# Patient Record
Sex: Male | Born: 1995 | Race: White | Hispanic: No | Marital: Single
Health system: Southern US, Academic
[De-identification: ages and names within clinical notes are randomized; demographics above are authoritative.]

## PROBLEM LIST (undated history)

## (undated) ENCOUNTER — Encounter

## (undated) ENCOUNTER — Telehealth

## (undated) ENCOUNTER — Ambulatory Visit: Payer: BLUE CROSS/BLUE SHIELD

## (undated) ENCOUNTER — Encounter: Attending: Pediatrics | Primary: Pediatrics

## (undated) ENCOUNTER — Ambulatory Visit

## (undated) ENCOUNTER — Encounter: Payer: PRIVATE HEALTH INSURANCE | Attending: Pediatrics | Primary: Pediatrics

## (undated) ENCOUNTER — Telehealth: Attending: Pediatrics | Primary: Pediatrics

## (undated) DIAGNOSIS — K5090 Crohn's disease, unspecified, without complications: Secondary | ICD-10-CM

---
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
## 2016-11-02 ENCOUNTER — Ambulatory Visit
Admission: RE | Admit: 2016-11-02 | Discharge: 2016-11-02 | Disposition: A | Discharge status: Home / Self Care | Payer: BC Managed Care – PPO | Admitting: Pediatrics

## 2016-11-02 DIAGNOSIS — K50814 Crohn's disease of both small and large intestine with abscess: Principal | ICD-10-CM

## 2017-03-17 ENCOUNTER — Encounter
Admit: 2017-03-17 | Discharge: 2017-03-18 | Payer: PRIVATE HEALTH INSURANCE | Attending: Pediatrics | Primary: Pediatrics

## 2017-03-17 DIAGNOSIS — K50814 Crohn's disease of both small and large intestine with abscess: Principal | ICD-10-CM

## 2017-03-17 DIAGNOSIS — K5080 Crohn's disease of both small and large intestine without complications: Secondary | ICD-10-CM

## 2017-05-16 MED ORDER — USTEKINUMAB 90 MG/ML SUBCUTANEOUS SYRINGE
INJECTION | PRN refills | 0 days | Status: CP
Start: 2017-05-16 — End: 2018-04-13

## 2017-07-10 ENCOUNTER — Encounter: Admit: 2017-07-10 | Discharge: 2017-07-11 | Payer: PRIVATE HEALTH INSURANCE

## 2017-07-10 DIAGNOSIS — R399 Unspecified symptoms and signs involving the genitourinary system: Principal | ICD-10-CM

## 2017-07-10 DIAGNOSIS — R350 Frequency of micturition: Secondary | ICD-10-CM

## 2017-07-24 ENCOUNTER — Ambulatory Visit: Payer: Self-pay

## 2017-08-19 ENCOUNTER — Ambulatory Visit
Admission: EM | Admit: 2017-08-19 | Discharge: 2017-08-19 | Disposition: A | Discharge status: Home / Self Care | Payer: BLUE CROSS/BLUE SHIELD | Attending: Family Medicine | Admitting: Family Medicine

## 2017-08-19 ENCOUNTER — Other Ambulatory Visit: Payer: Self-pay

## 2017-08-19 DIAGNOSIS — R05 Cough: Secondary | ICD-10-CM

## 2017-08-19 DIAGNOSIS — R059 Cough, unspecified: Secondary | ICD-10-CM

## 2017-08-19 DIAGNOSIS — J209 Acute bronchitis, unspecified: Secondary | ICD-10-CM | POA: Diagnosis not present

## 2017-08-19 HISTORY — DX: Crohn's disease, unspecified, without complications: K50.90

## 2017-08-19 MED ORDER — AZITHROMYCIN 250 MG PO TABS: ORAL_TABLET | ORAL | 0 refills | Status: AC

## 2017-08-19 MED ORDER — BENZONATATE 200 MG PO CAPS: 200.0000 mg | ORAL_CAPSULE | Freq: Three times a day (TID) | ORAL | 0 refills | Status: AC | PRN

## 2017-08-19 NOTE — ED Triage Notes (Addendum)
Pt with 2 weeks of cough. Woke up today with left sided chest pain worse with cough and deep breath. Pain is dull and does not radiate. Pain 5/10 . Seen at PCP a few weeks ago and was given Prednisone and a cough medication

## 2017-08-20 NOTE — ED Provider Notes (Signed)
[facility]    CSN: [id number] Arrival date & time: 08/19/17  1524     History   Chief Complaint Chief Complaint  Patient presents with  . Pleurisy    HPI Darin Cook is a 22 y.o. male.   The history is provided by the patient.  URI  Presenting symptoms: cough   Severity:  Moderate Onset quality:  Sudden Duration:  3 weeks Timing:  Constant Progression:  Worsening Chronicity:  New Relieved by:  Nothing Ineffective treatments:  Prescription medications and OTC medications (reports taking prednisone and cough medicine given by PCP several weeks ago) Associated symptoms: no wheezing   Associated symptoms comment:  C/o left sided chest wall pain when coughing or taking deep breaths Risk factors: sick contacts   Risk factors: not elderly, no chronic cardiac disease, no chronic kidney disease, no chronic respiratory disease, no diabetes mellitus, no immunosuppression, no recent illness and no recent travel     Past Medical History:  Diagnosis Date  . Crohn's disease (HCC)     There are no active problems to display for this patient.   History reviewed. No pertinent surgical history.     Home Medications    Prior to Admission medications   Medication Sig Start Date End Date Taking? Authorizing Provider  ustekinumab (STELARA) 90 MG/ML SOSY injection Inject 1 prefilled syringe subcutaneously every 8 weeks 05/16/17  Yes [provider]  azithromycin (ZITHROMAX Z-PAK) 250 MG tablet 2 tabs po once day 1, then 1 tab po qd for next 4 days 08/19/17   Payton Mccallum, MD  benzonatate (TESSALON) 200 MG capsule Take 1 capsule (200 mg total) by mouth 3 (three) times daily as needed. 08/19/17   Payton Mccallum, MD  Multiple Vitamin (MULTI-VITAMINS) TABS Take by mouth.    [provider]    Family History History reviewed. No pertinent family history.  Social History Social History   Tobacco Use  . Smoking status: Never Smoker  . Smokeless  tobacco: Never Used  Substance Use Topics  . Alcohol use: Yes    Comment: social  . Drug use: Never     Allergies   Codeine   Review of Systems Review of Systems  Respiratory: Positive for cough. Negative for wheezing.      Physical Exam Triage Vital Signs ED Triage Vitals  Enc Vitals Group     BP 08/19/17 1537 133/78     Pulse Rate 08/19/17 1537 84     Resp 08/19/17 1537 16     Temp 08/19/17 1537 98.2 F (36.8 C)     Temp Source 08/19/17 1537 Oral     SpO2 08/19/17 1537 99 %     Weight 08/19/17 1540 138 lb (62.6 kg)     Height 08/19/17 1540 5\' 7"  (1.702 m)     Head Circumference --      Peak Flow --      Pain Score 08/19/17 1539 5     Pain Loc --      Pain Edu? --      Excl. in GC? --    No data found.  Updated Vital Signs BP 133/78 (BP Location: Right Arm)   Pulse 84   Temp 98.2 F (36.8 C) (Oral)   Resp 16   Ht 5\' 7"  (1.702 m)   Wt 62.6 kg   SpO2 99%   BMI 21.61 kg/m   Visual Acuity Right Eye Distance:   Left Eye Distance:   Bilateral Distance:  Right Eye Near:   Left Eye Near:    Bilateral Near:     Physical Exam  Constitutional: He appears well-developed and well-nourished. No distress.  HENT:  Head: Normocephalic and atraumatic.  Right Ear: Tympanic membrane, external ear and ear canal normal.  Left Ear: Tympanic membrane, external ear and ear canal normal.  Nose: Nose normal.  Mouth/Throat: Uvula is midline, oropharynx is clear and moist and mucous membranes are normal. No oropharyngeal exudate or tonsillar abscesses.  Eyes: Pupils are equal, round, and reactive to light. Conjunctivae and EOM are normal. Right eye exhibits no discharge. Left eye exhibits no discharge. No scleral icterus.  Neck: Normal range of motion. Neck supple. No tracheal deviation present. No thyromegaly present.  Cardiovascular: Normal rate, regular rhythm and normal heart sounds.  Pulmonary/Chest: Effort normal. No stridor. No respiratory distress. He has no  wheezes. He has no rales. He exhibits no tenderness.  Few, diffuse rhochi  Lymphadenopathy:    He has no cervical adenopathy.  Neurological: He is alert.  Skin: Skin is warm and dry. No rash noted. He is not diaphoretic.  Nursing note and vitals reviewed.    UC Treatments / Results  Labs (all labs ordered are listed, but only abnormal results are displayed) Labs Reviewed - No data to display  EKG None  Radiology No results found.  Procedures Procedures (including critical care time)  Medications Ordered in UC Medications - No data to display  Initial Impression / Assessment and Plan / UC Course  I have reviewed the triage vital signs and the nursing notes.  Pertinent labs & imaging results that were available during my care of the patient were reviewed by me and considered in my medical decision making (see chart for details).      Final Clinical Impressions(s) / UC Diagnoses   Final diagnoses:  Cough  Acute bronchitis, unspecified organism   Discharge Instructions   None    ED Prescriptions    Medication Sig Dispense Auth. Provider   azithromycin (ZITHROMAX Z-PAK) 250 MG tablet 2 tabs po once day 1, then 1 tab po qd for next 4 days 6 each Payton Mccallumonty, Creasie Lacosse, MD   benzonatate (TESSALON) 200 MG capsule Take 1 capsule (200 mg total) by mouth 3 (three) times daily as needed. 30 capsule Payton Mccallumonty, Laure Leone, MD     1. diagnosis reviewed with patient 2. rx as per orders above; reviewed possible side effects, interactions, risks and benefits  3. Recommend supportive treatment with otc analgesics prn 4. Follow-up prn if symptoms worsen or don't improve   Controlled Substance Prescriptions [place] Controlled Substance Registry consulted? Not Applicable   Payton Mccallumonty, Nicey Krah, MD 08/20/17 [phone]

## 2017-08-23 ENCOUNTER — Encounter
Admit: 2017-08-23 | Discharge: 2017-08-24 | Payer: PRIVATE HEALTH INSURANCE | Attending: Pediatrics | Primary: Pediatrics

## 2017-08-23 DIAGNOSIS — K50814 Crohn's disease of both small and large intestine with abscess: Principal | ICD-10-CM

## 2017-08-23 DIAGNOSIS — K5080 Crohn's disease of both small and large intestine without complications: Secondary | ICD-10-CM

## 2017-12-29 ENCOUNTER — Encounter
Admit: 2017-12-29 | Discharge: 2017-12-30 | Payer: PRIVATE HEALTH INSURANCE | Attending: Pediatrics | Primary: Pediatrics

## 2017-12-29 DIAGNOSIS — K50814 Crohn's disease of both small and large intestine with abscess: Principal | ICD-10-CM

## 2018-04-13 MED ORDER — STELARA 90 MG/ML SUBCUTANEOUS SYRINGE
INJECTION | 3 refills | 0 days | Status: CP
Start: 2018-04-13 — End: ?

## 2018-11-02 DIAGNOSIS — K50019 Crohn's disease of small intestine with unspecified complications: Principal | ICD-10-CM

## 2018-11-02 MED ORDER — STELARA 90 MG/ML SUBCUTANEOUS SYRINGE: Syringe | 1 refills | 0 days | Status: AC

## 2018-11-27 DIAGNOSIS — K50019 Crohn's disease of small intestine with unspecified complications: Principal | ICD-10-CM

## 2018-11-27 MED ORDER — STELARA 90 MG/ML SUBCUTANEOUS SYRINGE
INJECTION | 1 refills | 0 days | Status: CP
Start: 2018-11-27 — End: ?

## 2018-12-18 DIAGNOSIS — K50814 Crohn's disease of both small and large intestine with abscess: Principal | ICD-10-CM

## 2018-12-24 ENCOUNTER — Encounter
Admit: 2018-12-24 | Discharge: 2018-12-25 | Payer: PRIVATE HEALTH INSURANCE | Attending: Pediatrics | Primary: Pediatrics

## 2018-12-24 DIAGNOSIS — R05 Cough: Principal | ICD-10-CM

## 2018-12-24 DIAGNOSIS — K50019 Crohn's disease of small intestine with unspecified complications: Principal | ICD-10-CM

## 2018-12-24 MED ORDER — STELARA 90 MG/ML SUBCUTANEOUS SYRINGE
INJECTION | 6 refills | 0 days | Status: CP
Start: 2018-12-24 — End: ?

## 2019-08-09 ENCOUNTER — Encounter
Admit: 2019-08-09 | Discharge: 2019-08-10 | Payer: PRIVATE HEALTH INSURANCE | Attending: Pediatrics | Primary: Pediatrics

## 2019-08-09 DIAGNOSIS — K50814 Crohn's disease of both small and large intestine with abscess: Principal | ICD-10-CM

## 2019-08-27 MED ORDER — ERGOCALCIFEROL (VITAMIN D2) 1,250 MCG (50,000 UNIT) CAPSULE
ORAL_CAPSULE | ORAL | 3 refills | 42.00000 days | Status: CP
Start: 2019-08-27 — End: 2020-02-11

## 2019-12-25 DIAGNOSIS — K50019 Crohn's disease of small intestine with unspecified complications: Principal | ICD-10-CM

## 2019-12-25 MED ORDER — STELARA 90 MG/ML SUBCUTANEOUS SYRINGE
SUBCUTANEOUS | 1 refills | 56 days | Status: CP
Start: 2019-12-25 — End: 2020-04-15

## 2020-04-14 DIAGNOSIS — K50019 Crohn's disease of small intestine with unspecified complications: Principal | ICD-10-CM

## 2020-04-14 MED ORDER — STELARA 90 MG/ML SUBCUTANEOUS SYRINGE
SUBCUTANEOUS | 1 refills | 56 days | Status: CP
Start: 2020-04-14 — End: 2020-08-04

## 2020-07-27 DIAGNOSIS — K50019 Crohn's disease of small intestine with unspecified complications: Principal | ICD-10-CM

## 2020-07-27 MED ORDER — STELARA 90 MG/ML SUBCUTANEOUS SYRINGE
SUBCUTANEOUS | 0 refills | 56 days | Status: CP
Start: 2020-07-27 — End: 2020-09-21

## 2020-09-07 ENCOUNTER — Ambulatory Visit
Admit: 2020-09-07 | Discharge: 2020-09-08 | Payer: PRIVATE HEALTH INSURANCE | Attending: Pediatrics | Primary: Pediatrics

## 2020-09-07 DIAGNOSIS — K50019 Crohn's disease of small intestine with unspecified complications: Principal | ICD-10-CM

## 2020-09-07 DIAGNOSIS — K50814 Crohn's disease of both small and large intestine with abscess: Principal | ICD-10-CM

## 2020-09-23 DIAGNOSIS — K50019 Crohn's disease of small intestine with unspecified complications: Principal | ICD-10-CM

## 2020-09-23 MED ORDER — STELARA 90 MG/ML SUBCUTANEOUS SYRINGE
2 refills | 0 days | Status: CP
Start: 2020-09-23 — End: 2021-03-10

## 2020-11-17 ENCOUNTER — Ambulatory Visit: Admit: 2020-11-17 | Discharge: 2020-11-18 | Payer: PRIVATE HEALTH INSURANCE

## 2020-11-17 DIAGNOSIS — K50814 Crohn's disease of both small and large intestine with abscess: Principal | ICD-10-CM

## 2021-02-03 DIAGNOSIS — K50019 Crohn's disease of small intestine with unspecified complications: Principal | ICD-10-CM

## 2021-02-03 MED ORDER — USTEKINUMAB 90 MG/ML SUBCUTANEOUS SYRINGE
SUBCUTANEOUS | 6 refills | 56.00000 days | Status: CP
Start: 2021-02-03 — End: 2021-07-21

## 2021-02-04 DIAGNOSIS — K50019 Crohn's disease of small intestine with unspecified complications: Principal | ICD-10-CM

## 2021-05-25 ENCOUNTER — Ambulatory Visit: Admit: 2021-05-25 | Discharge: 2021-05-26 | Payer: PRIVATE HEALTH INSURANCE

## 2021-05-25 DIAGNOSIS — K50814 Crohn's disease of both small and large intestine with abscess: Principal | ICD-10-CM

## 2021-08-10 MED ORDER — BISACODYL 5 MG TABLET,DELAYED RELEASE
ORAL_TABLET | Freq: Once | ORAL | 0 refills | 1 days | Status: CP
Start: 2021-08-10 — End: 2021-08-10

## 2021-08-10 MED ORDER — SIMETHICONE 125 MG CHEWABLE TABLET
ORAL_TABLET | ORAL | 0 refills | 0 days | Status: CP
Start: 2021-08-10 — End: ?

## 2021-08-10 MED ORDER — POLYETHYLENE GLYCOL 3350 17 GRAM/DOSE ORAL POWDER
ORAL | 0 refills | 0.00000 days | Status: CP
Start: 2021-08-10 — End: ?

## 2021-08-13 ENCOUNTER — Ambulatory Visit: Admit: 2021-08-13 | Discharge: 2021-08-13 | Payer: PRIVATE HEALTH INSURANCE

## 2021-08-13 ENCOUNTER — Encounter
Admit: 2021-08-13 | Discharge: 2021-08-13 | Payer: PRIVATE HEALTH INSURANCE | Attending: Anesthesiology | Primary: Anesthesiology

## 2021-11-30 ENCOUNTER — Ambulatory Visit: Admit: 2021-11-30 | Discharge: 2021-12-01 | Payer: PRIVATE HEALTH INSURANCE

## 2021-11-30 DIAGNOSIS — E559 Vitamin D deficiency, unspecified: Principal | ICD-10-CM

## 2021-11-30 DIAGNOSIS — Z79899 Other long term (current) drug therapy: Principal | ICD-10-CM

## 2021-11-30 DIAGNOSIS — K5080 Crohn's disease of both small and large intestine without complications: Principal | ICD-10-CM

## 2021-11-30 MED ORDER — CHOLECALCIFEROL (VITAMIN D3) 1,250 MCG (50,000 UNIT) CAPSULE
ORAL_CAPSULE | ORAL | 0 refills | 56 days | Status: CP
Start: 2021-11-30 — End: 2022-01-19

## 2022-01-31 DIAGNOSIS — K5080 Crohn's disease of both small and large intestine without complications: Principal | ICD-10-CM

## 2022-01-31 MED ORDER — STELARA 90 MG/ML SUBCUTANEOUS SYRINGE
SUBCUTANEOUS | 2 refills | 56 days | Status: CP
Start: 2022-01-31 — End: ?

## 2022-02-06 DIAGNOSIS — K5080 Crohn's disease of both small and large intestine without complications: Principal | ICD-10-CM

## 2022-02-23 DIAGNOSIS — K5080 Crohn's disease of both small and large intestine without complications: Principal | ICD-10-CM

## 2022-02-24 DIAGNOSIS — K5080 Crohn's disease of both small and large intestine without complications: Principal | ICD-10-CM

## 2022-02-25 DIAGNOSIS — K5080 Crohn's disease of both small and large intestine without complications: Principal | ICD-10-CM

## 2022-02-25 MED ORDER — STELARA 90 MG/ML SUBCUTANEOUS SYRINGE
SUBCUTANEOUS | 1 refills | 56 days | Status: CP
Start: 2022-02-25 — End: ?
  Filled 2022-03-14: qty 1, 56d supply, fill #0

## 2022-03-02 NOTE — Unmapped (Signed)
[facility] Specialty Medication Onboarding    Specialty Medication: STELARA 90 mg/mL Syrg syringe (ustekinumab)  Prior Authorization: Approved   Financial Assistance: Yes - copay card approved as secondary   Final Copay/Day Supply: $5 / 56 days    Insurance Restrictions: None     Notes to Pharmacist:     The triage team has completed the benefits investigation and has determined that the patient is able to fill this medication at [facility]. Please contact the patient to complete the onboarding or follow up with the prescribing physician as needed.

## 2022-03-02 NOTE — Unmapped (Signed)
 [facility]   Patient Onboarding/Medication Counseling    [facility] MyChart Questionnaire Opt In       Allen Harris is a 27 y.o. male with Crohn's disease who I am counseling today on continuation of therapy.  I am speaking to the patient.    Was a Nurse, learning disability used for this call? No    Verified patient's date of birth / HIPAA.    Specialty medication(s) to be sent: Inflammatory Disorders: Stelara      Non-specialty medications/supplies to be sent: sharps      Medications not needed at this time: n/a         Stelara (ustekinumab)    The patient declined counseling on medication administration, missed dose instructions, goals of therapy, side effects and monitoring parameters, warnings and precautions, drug/food interactions, and storage, handling precautions, and disposal because they have taken the medication previously. The information in the declined sections below are for informational purposes only and was not discussed with patient.       Medication & Administration     Dosage: Crohn's disease: Inject 90mg  under the skin every 8 weeks staring 8 weeks after IV induction dose    Lab tests required prior to treatment initiation:  Tuberculosis: Tuberculosis screening resulted in a non-reactive Quantiferon TB Gold assay.    Administration:     Artist all supplies needed for injection on a clean, flat working surface: medication syringe(s) removed from packaging, alcohol swab, sharps container, etc.  Look at the medication label - look for correct medication, correct dose, and check the expiration date  Look at the medication - the liquid in the syringe should appear clear and colorless to slightly yellow, you may see a few white particles  Lay the syringe on a flat surface and allow it to warm up to room temperature for at least 15-30 minutes  Select injection site - you can use the front of your thigh or your belly (but not the area 2 inches around your belly button); if someone else is giving you the injection you can also use your upper arm in the skin covering your triceps muscle or in the buttocks  Prepare injection site - wash your hands and clean the skin at the injection site with an alcohol swab and let it air dry, do not touch the injection site again before the injection  Pull off the needle safety cap, do not remove until immediately prior to injection  Pinch the skin - with your hand not holding the syringe pinch up a fold of skin at the injection site using your forefinger and thumb  Insert the needle into the fold of skin at about a 45 degree angle - it's best to use a quick dart-like motion  Push the plunger down slowly as far as it will go until the syringe is empty, if the plunger is not fully depressed the needle shield will not extend to cover the needle when it is removed, hold the syringe in place for a full 5 seconds  Check that the syringe is empty and keep pressing down on the plunger while you pull the needle out at the same angle as inserted; after the needle is removed completely from the skin, release the plunger allowing the needle shield to activate and cover the used needle  Dispose of the used syringe immediately in your sharps disposal container, do not attempt to recap the needle prior to disposing  If you see any  blood at the injection site, press a cotton ball or gauze on the site and maintain pressure until the bleeding stops, do not rub the injection site      Adherence/Missed dose instructions:  If your injection is given more than 7-10 days after your scheduled injection date - consult your pharmacist for additional instructions on how to adjust your dosing schedule.    Goals of Therapy     Crohn's disease  Achieve remission of symptoms  Maintain remission of symptoms  Minimize long-term systemic glucocorticoid use  Prevent need for surgical procedures  Maintenance of effective psychosocial functioning      Side Effects & Monitoring Parameters Injection site reaction (redness, irritation, inflammation localized to the site of administration)  Signs of a common cold - minor sore throat, runny or stuffy nose, etc.  Feeling tired or weak  Headache    The following side effects should be reported to the provider:  Signs of a hypersensitivity reaction - rash; hives; itching; red, swollen, blistered, or peeling skin; wheezing; tightness in the chest or throat; difficulty breathing, swallowing, or talking; swelling of the mouth, face, lips, tongue, or throat; etc.  Reduced immune function - report signs of infection such as fever; chills; body aches; very bad sore throat; ear or sinus pain; cough; more sputum or change in color of sputum; pain with passing urine; wound that will not heal, etc.  Also at a slightly higher risk of some malignancies (mainly skin and blood cancers) due to this reduced immune function.  In the case of signs of infection - the patient should hold the next dose of Stelara?? and call your primary care provider to ensure adequate medical care.  Treatment may be resumed when infection is treated and patient is asymptomatic.  Changes in skin - a new growth or lump that forms; changes in shape, size, or color of a previous mole or marking  Shortness of breath or chest pain  Vaginal itching or discharge      Contraindications, Warnings, & Precautions     Have your bloodwork checked as you have been told by your prescriber  Talk with your doctor if you are pregnant, planning to become pregnant, or breastfeeding  Discuss the possible need for holding your dose(s) of Stelara?? when a planned procedure is scheduled with the prescriber as it may delay healing/recovery timeline       Drug/Food Interactions     Medication list reviewed in Epic. The patient was instructed to inform the care team before taking any new medications or supplements. No drug interactions identified.   If you have a latex allergy use caution when handling, the needle cap of the Stelara?? prefilled syringe contains a derivative of natural rubber latex  Talk with you prescriber or pharmacist before receiving any live vaccinations while taking this medication and after you stop taking it    Storage, Handling Precautions, & Disposal     Store this medication in the refrigerator.  Do not freeze  If needed, you may store at room temperature for up to 30 days  Store in original packaging, protected from light  Do not shake  Dispose of used syringes/pens in a sharps disposal container          Current Medications (including OTC/herbals), Comorbidities and Allergies     Current Outpatient Medications   Medication Sig Dispense Refill    multivitamin (THERAGRAN) per tablet Take 1 tablet by mouth daily.      STELARA 90 mg/mL  Syrg syringe Inject the contents of 1 syringe (90 mg total) under the skin every 8 weeks. 1 mL 1     No current facility-administered medications for this visit.       Allergies   Allergen Reactions    Codeine Nausea Only, Swelling and Nausea And Vomiting       Patient Active Problem List   Diagnosis    Adjustment disorder with anxiety    Anxiety    Crohn's disease of small intestine (CMS-HCC)    Crohn's disease of both small and large intestine without complication (CMS-HCC)       Reviewed and up to date in Epic.    Appropriateness of Therapy     Acute infections noted within Epic:  No active infections  Patient reported infection: None    Is medication and dose appropriate based on diagnosis and infection status? Yes    Prescription has been clinically reviewed: Yes      Baseline Quality of Life Assessment      How many days over the past month did your Crohn's disease  keep you from your normal activities? For example, brushing your teeth or getting up in the morning. 0    Financial Information     Medication Assistance provided: Prior Authorization    Anticipated copay of $5 reviewed with patient. Verified delivery address.    Delivery Information     Scheduled delivery date: 3/8    Expected start date: 3/4    Patient was notified of new phone menu: Yes    Medication will be delivered via Same Day Courier to the prescription address in Epic [place].  This shipment will not require a signature.      Explained the services we provide at [facility] and that each month we would call to set up refills.  Stressed importance of returning phone calls so that we could ensure they receive their medications in time each month.  Informed patient that we should be setting up refills 7-10 days prior to when they will run out of medication.  A pharmacist will reach out to perform a clinical assessment periodically.  Informed patient that a welcome packet, containing information about our pharmacy and other support services, a Notice of Privacy Practices, and a drug information handout will be sent.      The patient or caregiver noted above participated in the development of this care plan and knows that they can request review of or adjustments to the care plan at any time.      Patient or caregiver verbalized understanding of the above information as well as how to contact the pharmacy at [phone] option 4 with any questions/concerns.  The pharmacy is open Monday through Friday 8:30am-4:30pm.  A pharmacist is available 24/7 via pager to answer any clinical questions they may have.    Patient Specific Needs     Does the patient have any physical, cognitive, or cultural barriers? No    Does the patient have adequate living arrangements? (i.e. the ability to store and take their medication appropriately) Yes    Did you identify any home environmental safety or security hazards? No    Patient prefers to have medications discussed with  Patient     Is the patient or caregiver able to read and understand education materials at a high school level or above? Yes    Patient's primary language is  English     Is the patient high risk? No  SOCIAL DETERMINANTS OF HEALTH     At the [facility], we have learned that life circumstances - like trouble affording food, housing, utilities, or transportation can affect the health of many of our patients.   That is why we wanted to ask: are you currently experiencing any life circumstances that are negatively impacting your health and/or quality of life? No    Social Determinants of Psychologist, prison and probation services Strain: Not on file   Internet Connectivity: Not on file   Food Insecurity: Not on file   Tobacco Use: Low Risk  (11/30/2021)    Patient History     Smoking Tobacco Use: Never     Smokeless Tobacco Use: Never     Passive Exposure: Not on file   Housing/Utilities: Not on file   Alcohol Use: Not on file   Transportation Needs: Not on file   Substance Use: Not on file   Health Literacy: Not on file   Physical Activity: Not on file   Interpersonal Safety: Not on file   Stress: Not on file   Intimate Partner Violence: Not on file   Depression: Not at risk (07/16/2021)    Received from [facility]    PHQ-2     Total Score =: 0   Social Connections: Not on file       Would you be willing to receive help with any of the needs that you have identified today? Not applicable       Teofilo Pod, PharmD  [facility] Specialty Pharmacist

## 2022-03-07 MED ORDER — EMPTY CONTAINER
3 refills | 0 days
Start: 2022-03-07 — End: ?

## 2022-03-14 MED FILL — EMPTY CONTAINER: 120 days supply | Qty: 1 | Fill #0

## 2022-05-02 NOTE — Unmapped (Signed)
[facility] Clinical Assessment & Refill Coordination Note    Allen Harris, DOB: [birth year]  Phone: [phone] (home)     All above HIPAA information was verified with patient.     Was a Nurse, learning disability used for this call? No    Specialty Medication(s):   Inflammatory Disorders: Stelara     Current Outpatient Medications   Medication Sig Dispense Refill    empty container Misc Use as directed to dispose of Stelara 1 each 3    multivitamin (THERAGRAN) per tablet Take 1 tablet by mouth daily.      STELARA 90 mg/mL Syrg syringe Inject the contents of 1 syringe (90 mg total) under the skin every 8 weeks. 1 mL 1     No current facility-administered medications for this visit.        Changes to medications: Jaimar reports no changes at this time.    Allergies   Allergen Reactions    Codeine Nausea Only, Swelling and Nausea And Vomiting       Changes to allergies: No    SPECIALTY MEDICATION ADHERENCE     Stelara 90 mg/ml: 0 days of medicine on hand       Medication Adherence    Patient reported X missed doses in the last month: 0  Specialty Medication: Stelara          Specialty medication(s) dose(s) confirmed: Regimen is correct and unchanged.     Are there any concerns with adherence? No    Adherence counseling provided? Not needed    CLINICAL MANAGEMENT AND INTERVENTION      Clinical Benefit Assessment:    Do you feel the medicine is effective or helping your condition? Yes    Clinical Benefit counseling provided? Not needed    Adverse Effects Assessment:    Are you experiencing any side effects? No    Are you experiencing difficulty administering your medicine? No    Quality of Life Assessment:    Quality of Life    Rheumatology  Oncology  Dermatology  Cystic Fibrosis          How many days over the past month did your Crohn's disease  keep you from your normal activities? For example, brushing your teeth or getting up in the morning. 0    Have you discussed this with your provider? Not needed    Acute Infection Status:    Acute infections noted within Epic:  No active infections  Patient reported infection: None    Therapy Appropriateness:    Is therapy appropriate and patient progressing towards therapeutic goals? Yes, therapy is appropriate and should be continued    DISEASE/MEDICATION-SPECIFIC INFORMATION      For patients on injectable medications: Patient currently has 0 doses left.  Next injection is scheduled for 4/29.    Chronic Inflammatory Diseases: Have you experienced any flares in the last month? No  Has this been reported to your provider? Not applicable    PATIENT SPECIFIC NEEDS     Does the patient have any physical, cognitive, or cultural barriers? No    Is the patient high risk? No    Did the patient require a clinical intervention? No    Does the patient require physician intervention or other additional services (i.e., nutrition, smoking cessation, social work)? No    SOCIAL DETERMINANTS OF HEALTH     At the [facility], we have learned that life circumstances - like trouble affording food, housing, utilities, or transportation  can affect the health of many of our patients.   That is why we wanted to ask: are you currently experiencing any life circumstances that are negatively impacting your health and/or quality of life? Patient declined to answer    Social Determinants of Health     Financial Resource Strain: Not on file   Internet Connectivity: Not on file   Food Insecurity: Not on file   Tobacco Use: Low Risk  (11/30/2021)    Patient History     Smoking Tobacco Use: Never     Smokeless Tobacco Use: Never     Passive Exposure: Not on file   Housing/Utilities: Not on file   Alcohol Use: Not on file   Transportation Needs: Not on file   Substance Use: Not on file   Health Literacy: Not on file   Physical Activity: Not on file   Interpersonal Safety: Not on file   Stress: Not on file   Intimate Partner Violence: Not on file   Depression: Not at risk (07/16/2021)    Received from [facility]    PHQ-2     Total Score =: 0   Social Connections: Not on file       Would you be willing to receive help with any of the needs that you have identified today? Not applicable       SHIPPING     Specialty Medication(s) to be Shipped:   Inflammatory Disorders: Stelara    Other medication(s) to be shipped: No additional medications requested for fill at this time     Changes to insurance: No    Delivery Scheduled: Yes, Expected medication delivery date: 4/25.     Medication will be delivered via Same Day Courier to the confirmed prescription address in [facility].    The patient will receive a drug information handout for each medication shipped and additional FDA Medication Guides as required.  Verified that patient has previously received a Conservation officer, historic buildings and a Surveyor, mining.    The patient or caregiver noted above participated in the development of this care plan and knows that they can request review of or adjustments to the care plan at any time.      All of the patient's questions and concerns have been addressed.    Clydell Hakim, PharmD   [facility] Specialty Pharmacist

## 2022-05-05 MED FILL — STELARA 90 MG/ML SUBCUTANEOUS SYRINGE: SUBCUTANEOUS | 56 days supply | Qty: 1 | Fill #1

## 2022-05-24 ENCOUNTER — Ambulatory Visit: Admit: 2022-05-24 | Discharge: 2022-05-25 | Payer: PRIVATE HEALTH INSURANCE

## 2022-05-24 DIAGNOSIS — K5080 Crohn's disease of both small and large intestine without complications: Principal | ICD-10-CM

## 2022-05-24 DIAGNOSIS — D1801 Hemangioma of skin and subcutaneous tissue: Principal | ICD-10-CM

## 2022-05-24 LAB — COMPREHENSIVE METABOLIC PANEL
ALBUMIN: 4.7 g/dL (ref 3.4–5.0)
ALKALINE PHOSPHATASE: 105 U/L (ref 46–116)
ALT (SGPT): 18 U/L (ref 10–49)
ANION GAP: 8 mmol/L (ref 5–14)
AST (SGOT): 17 U/L (ref <=34)
BILIRUBIN TOTAL: 0.6 mg/dL (ref 0.3–1.2)
BLOOD UREA NITROGEN: 12 mg/dL (ref 9–23)
BUN / CREAT RATIO: 16
CALCIUM: 9.8 mg/dL (ref 8.7–10.4)
CHLORIDE: 105 mmol/L (ref 98–107)
CO2: 26.9 mmol/L (ref 20.0–31.0)
CREATININE: 0.74 mg/dL
EGFR CKD-EPI (2021) MALE: >90 mL/min/{1.73_m2} (ref >=60)
GLUCOSE RANDOM: 89 mg/dL (ref 70–99)
POTASSIUM: 3.8 mmol/L (ref 3.4–4.8)
PROTEIN TOTAL: 7.8 g/dL (ref 5.7–8.2)
SODIUM: 140 mmol/L (ref 135–145)

## 2022-05-24 LAB — CBC
HEMATOCRIT: 41.9 % (ref 39.0–48.0)
HEMOGLOBIN: 14.4 g/dL (ref 12.9–16.5)
MEAN CORPUSCULAR HEMOGLOBIN CONC: 34.3 g/dL (ref 32.0–36.0)
MEAN CORPUSCULAR HEMOGLOBIN: 28.9 pg (ref 25.9–32.4)
MEAN CORPUSCULAR VOLUME: 84.2 fL (ref 77.6–95.7)
MEAN PLATELET VOLUME: 7 fL (ref 6.8–10.7)
PLATELET COUNT: 384 10*9/L (ref 150–450)
RED BLOOD CELL COUNT: 4.98 10*12/L (ref 4.26–5.60)
RED CELL DISTRIBUTION WIDTH: 12.7 % (ref 12.2–15.2)
WBC ADJUSTED: 7.6 10*9/L (ref 3.6–11.2)

## 2022-05-24 NOTE — Unmapped (Addendum)
Manoah Ritsema it was a pleasure seeing you today.  Here is a summary from today's visit:     1.  We will order some labs today. I will message you with the results.  2.  For the red lesion on your face, this is most consistent with a hemangioma, it is a common finding and collection of blood vessels under the skin. Given that it is on your face and bothering you, we will refer you to dermatology to discuss potential treatment options.   3.  If any trouble or symptoms, do not hesitate to call.         EXPECTATIONS FOR PATIENT FOLLOW UP AND COMMUNICATION:  -- Follow up Appointments:  Crohn's disease and Ulcerative colitis are serious chronic inflammatory diseases which require close monitoring.  I expect to see patients for a follow up visits at least every 6 months (or more often if you are having a flare, starting new therapy, etc).  In select cases, patients who are only on aminosalicylates / mesalamine, may be seen once per year for follow up.  If you are not able to come for follow up appointments we may not be able to refill your medications or continue caring for you.   -- Appointment Type: While we are continuing to offer video appointments in select cases (stable patients who are not in a flare and without new/active symptoms) during the COVID19 pandemic, I expect to see patients in person at least once per year.   -- Communication:  if you have any questions or concerns, you can communicate with [place] via phone (contact information below) or via myChart.  Note that phone messages are given higher priority and triaged first. For urgent issues, please contact us via phone.      IBD NURSE COORDINATOR CONTACT - Christy Gentles, RN     Phone: [phone] (direct line)      Fax: [phone]  * For urgent medical concerns after hours or on weekends and holidays, call [phone] and ask to speak to the GI Fellow on call.    * If you have a GI medical question or GI symptoms and would like to speak to your provider's healthcare team, please contact Christy Gentles, RN (contact information above) OR you can send the GI healthcare team a message through MyChart at [email or web address]    [facility] MESSAGES  -- MyChart messages and advice requests are now going to be sent to our clinical care team. A team member will respond to the message or send it to your provider if needed.   -- The message will typically be addressed within 3-4 business days if it is an issue that can be handled by MyChart.   -- For multiple questions, complex concerns, and/or if you have not been by seen by your provider in the recommended follow-up period you may be asked to schedule a clinic visit.   -- MyChart messages are NOT read after hours or on weekends. MyChart is NOT meant for urgent issues or emergencies. For emergencies call 911 or go to the nearest emergency department.     APPOINTMENT SCHEDULING FOR GI CLINIC AND GI PROCEDURES:  RADIOLOGY - to schedule imaging ordered, please call [phone]  GI PROCEDURES         [phone] option 2   GI MEDICINE CLINIC  [phone] option 1   *To schedule, reschedule, or cancel your GI appointment, please call [phone]. If you are unable to come to an appointment, please notify us  as soon as possible, preferably 24 hours in advance. Doing so may allow other patients with urgent needs to be scheduled in a cancelled appointment slot.     TEST RESULTS   If you have a MyChart account, your new results and a provider message will be sent to you through your MyChart account at [email or web address]. For results that require follow-up, a member of your healthcare team will also contact you.    PRESCRIPTION REFILL REQUESTS  To request prescription refills, please contact your pharmacy or send your healthcare team a message through your MyChart account at [email or web address]  RECORD REQUESTS  For questions related to medical records, please call Medical Records Release of Information at [phone]  FINANCIAL NAVIGATOR   For billing and other financial questions/needs - please contact Kathrin Greathouse - [phone]. If you need to leave a message, please be sure to leave your full name, date of birth or Medical record number, best call back # and reason for call.    For educational material and resources:  [email or web address]    ================================================================

## 2022-05-24 NOTE — Unmapped (Signed)
[facility]  INFLAMMATORY BOWEL DISEASE  05/24/2022    Demographics:  Allen Harris is a 27 y.o. year old male    Referring physician:   Ciro Backer, MD  [address]  [id number]  [place],  [place] [address]          HPI / NOTE :   Allen Harris has been doing well lately. Tends to forget he has Crohn's. Having 1-2 BMS a day, no abdominal pain, no hematochezia, weight stable. 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 He uses ustekinumab as prescribed, and does not have any issues with medication availability or injection reactions.    Question regarding skin lesion on face. Bled when tried to pop it as thought it was a zit. Had one on back of neck and was itchy. Nontender. Is irritating as bleeds with shaving. Bothersome cosmetically as well given on face.           IBD HISTORY:     Year of disease onset: 2014  Diagnosis:  Crohn's Disease  Age at onset:   < 38 yr old (A1)  Location:  Ileocolonic (L3)  Behavior:  Nonstricturing,nonpenetrating (B1)  Perianal Dz:  Yes    Brief IBD Disease Course:    Diagnosed in high school. Has been in clinical remission on ustekinumab and pt is happy with disease control.     Endoscopy:      Colonoscopy 08/2021:   - The examined portion of the ileum was normal.  - The entire examined colon is normal.  - No specimens collected. Crohn's disease in endoscopic remission. (SES-CD=0)    Colonoscopy 12/08/2014:   - Perianal fistula found on perianal exam. Mild in severity.  - Mildly ulcerated mucosa in the recto-sigmoid colon. Biopsied.  - Moderately ulcerated mucosa in the terminal ileum. Biopsied.    EGD 12/08/2014:  - Moderately severe chronic esophagitis. Biopsied.  - Chronic gastritis. Biopsied.  - Normal examined duodenum. Biopsied.    Imaging:    MRI 2016  IMPRESSION:  Mild terminal ileitis. No evidence of abscess, stricture, or fistula. The appearance is much less severe than 2014.    Prior IBD medications (type, dose, duration, response):  Mercaptopurine   ustekinumab          Past Medical History:   Past medical history:   Past Medical History:   Diagnosis Date    Crohn's disease (CMS-HCC)     Elevated blood pressure reading 12/05/2016     Past surgical history:   Past Surgical History:   Procedure Laterality Date    PR BREATH HYDROGEN TEST N/A 04/22/2014    Procedure: BREATH HYDROGEN TEST;  Surgeon: Nurse-Based Giproc;  [facility];  Service: Gastroenterology    PR COLONOSCOPY FLX DX W/COLLJ SPEC WHEN PFRMD Left 08/13/2021    Procedure: COLONOSCOPY, FLEXIBLE, PROXIMAL TO SPLENIC FLEXURE; DIAGNOSTIC, W/WO COLLECTION SPECIMEN BY BRUSH OR WASH;  Surgeon: Modena Nunnery, MD;  Location: GI PROCEDURES [place] [facility];  Service: Gastroenterology    PR COLONOSCOPY W/BIOPSY SINGLE/MULTIPLE N/A 10/03/2012    Procedure: COLONOSCOPY, FLEXIBLE, PROXIMAL TO SPLENIC FLEXURE; WITH BIOPSY, SINGLE OR MULTIPLE;  Surgeon: Nat Christen, MD;  Location: PEDS PROCEDURE ROOM [facility];  Service: Gastroenterology    PR COLONOSCOPY W/BIOPSY SINGLE/MULTIPLE N/A 12/08/2014    Procedure: COLONOSCOPY, FLEXIBLE, PROXIMAL TO SPLENIC FLEXURE; WITH BIOPSY, SINGLE OR MULTIPLE;  Surgeon: Ciro Backer, MD;  Location: PEDS PROCEDURE ROOM [facility];  Service: Gastroenterology    PR  SURG DIAGNOSTIC EXAM, ANORECTAL N/A 10/09/2012    Procedure: ANORECTAL EXAM, SURGICAL, REQUIRING ANESTHESIA (GENERAL, SPINAL, OR EPIDURAL), DIAGNOSTIC;  Surgeon: Bunnie Pion, MD;  Location: [facility];  Service: Pediatric Surgery    PR UPPER GI ENDOSCOPY,BIOPSY N/A 10/03/2012    Procedure: UGI ENDOSCOPY; WITH BIOPSY, SINGLE OR MULTIPLE;  Surgeon: Nat Christen, MD;  Location: PEDS PROCEDURE ROOM [facility];  Service: Gastroenterology    PR UPPER GI ENDOSCOPY,BIOPSY N/A 12/08/2014    Procedure: UGI ENDOSCOPY; WITH BIOPSY, SINGLE OR MULTIPLE;  Surgeon: Ciro Backer, MD;  Location: PEDS PROCEDURE ROOM [facility];  Service: Gastroenterology Family history:   Family History   Problem Relation Age of Onset    Other Mother      Social history:   Social History     Socioeconomic History    Marital status: Single     Spouse name: None    Number of children: None    Years of education: None    Highest education level: None   Tobacco Use    Smoking status: Never    Smokeless tobacco: Never   Substance and Sexual Activity    Alcohol use: Not Currently     Comment: maybe once a month    Drug use: No           Allergies:   Codeine          Medications:     Current Outpatient Medications   Medication Sig Dispense Refill    empty container Misc Use as directed to dispose of Stelara 1 each 3    multivitamin (THERAGRAN) per tablet Take 1 tablet by mouth daily.      STELARA 90 mg/mL Syrg syringe Inject the contents of 1 syringe (90 mg total) under the skin every 8 weeks. 1 mL 1     No current facility-administered medications for this visit.           Physical Exam:   BP 111/83 (BP Site: L Arm, BP Position: Sitting, BP Cuff Size: Large)  - Pulse 83  - Temp 36.6 ??C (97.8 ??F)  - Ht 170.2 cm (5' 7)  - Wt 72.8 kg (160 lb 6.4 oz)  - BMI 25.12 kg/m??     Gen: Well-appearing, no acute distress  Eyes: no scleral icterus.  Resp: Normal work of breathing  GI: Nontender, non-distended.  Skin: Red lesion on left side of face near mouth             Labs, Data & Indices:     Lab Review:   Lab Results   Component Value Date    WBC 6.0 11/30/2021    WBC 6.9 08/26/2019    RBC 5.24 11/30/2021    RBC 4.82 08/26/2019    HGB 15.0 11/30/2021    HGB 13.8 08/26/2019     Lab Results   Component Value Date    AST 16 11/30/2021    AST 20 08/26/2019    ALT 19 11/30/2021    ALT 27 08/26/2019    BUN 10 11/30/2021    BUN 14 08/26/2019    Creatinine 0.81 11/30/2021    Creatinine 0.91 08/26/2019    CO2 26.3 11/30/2021    CO2 25 08/26/2019    Albumin 4.4 11/30/2021    Albumin 5.0 03/25/2014    Calcium 9.8 11/30/2021    Calcium 9.7 08/26/2019     Lab Results   Component Value Date    TSH 1.830 08/15/2014  Disease Severity Index:   Harvey-Bradshaw Index:    1. General well-being: Very well = 0  2. Abdominal pain:  None = 0  3. Number of liquid or soft stools per day: 0  4. Abdominal mass: None = 0  5. Complications: None    Harvey-Bradshaw Index score:  Remission <5  ............................................................................................................................................Marland Kitchen          Assessment & Recommendations:     Allen Harris is a 27 y.o. 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    Symptomatically he doing well, and is in clinical remission.  His recent colonoscopy in August 2023 demonstrated endoscopic remission. He will continue ustekinumab q8w.    We reviewed his healthcare maintenance today. He needs surveillance colonoscopy in around 3 years (2026). Given low Vit D at last visit now with supplementation, will recheck today.     There are several vaccines he would benefit from, but he is not interested in any vaccines at this time.    PLAN:  -Continue ustekinumab q8w  -Labs today  -Referral to dermatology for assessment of hemangioma  -Return to clinic in 6-12 months    IBD Healthcare Maintenance   Last colorectal cancer screening  (q1-5 years, 8 years after diagnosis) 2026   Metabolic bone disease screening  (steroids >102mo, past steroid use >65yr in past 44yr, maternal FHx osteoporosis, underweight, amenorrheic, postmenopausal women) N/A   Vitamin D deficiency screening  (q57yr) Low in 11/2021   Vitamin B12 deficiency screening  (If PSHx of ileal resection) Today   Iron deficiency screening Normal May 2023                 Vaccines Varicella  (Zoster IgG, consider vaccination if -) N/A    Herpes Zoster  (Anticipating IS) Declined    Tdap  (TDAP q10y or Td q2y) Declined    Influenza  (q12yr) Declined    HPV  (Age 2-26) Declined    Hepatitis A N/A    Hepatitis B UTD, but loss of immunity. Declined booster    Meningococcus  (At risk--college, military) Declined    Pneumococcus  (PSV23 if not IS; PCV13 then PSV23 after 8w if IS; PSV23 booster q30yr) Declined   Annual Pap smear  (if male and IS) N/A   Annual dermatology exam  (if IS) Referral placed   Tobacco abstinence Does not use   NSAID abstinence Rare use     Patient seen with GI Fellow, Geralyn Flash, MD

## 2022-05-25 LAB — VITAMIN D 25 HYDROXY: VITAMIN D, TOTAL (25OH): 16.7 ng/mL — ABNORMAL LOW (ref 20.0–80.0)

## 2022-05-29 ENCOUNTER — Emergency Department
Admit: 2022-05-29 | Discharge: 2022-05-29 | Disposition: A | Discharge status: Home / Self Care | Payer: PRIVATE HEALTH INSURANCE

## 2022-05-29 ENCOUNTER — Ambulatory Visit
Admit: 2022-05-29 | Discharge: 2022-05-29 | Disposition: A | Discharge status: Home / Self Care | Payer: PRIVATE HEALTH INSURANCE

## 2022-05-29 DIAGNOSIS — R55 Syncope and collapse: Principal | ICD-10-CM

## 2022-05-29 LAB — COMPREHENSIVE METABOLIC PANEL
ALBUMIN: 4.7 g/dL (ref 3.4–5.0)
ALKALINE PHOSPHATASE: 110 U/L (ref 46–116)
ALT (SGPT): 17 U/L (ref 10–49)
ANION GAP: 6 mmol/L (ref 5–14)
AST (SGOT): 14 U/L (ref <=34)
BILIRUBIN TOTAL: 0.5 mg/dL (ref 0.3–1.2)
BLOOD UREA NITROGEN: 12 mg/dL (ref 9–23)
BUN / CREAT RATIO: 16
CALCIUM: 9.9 mg/dL (ref 8.7–10.4)
CHLORIDE: 108 mmol/L — ABNORMAL HIGH (ref 98–107)
CO2: 27.2 mmol/L (ref 20.0–31.0)
CREATININE: 0.74 mg/dL
EGFR CKD-EPI (2021) MALE: >90 mL/min/{1.73_m2} (ref >=60)
GLUCOSE RANDOM: 85 mg/dL (ref 70–179)
POTASSIUM: 3.5 mmol/L (ref 3.4–4.8)
PROTEIN TOTAL: 8 g/dL (ref 5.7–8.2)
SODIUM: 141 mmol/L (ref 135–145)

## 2022-05-29 LAB — CBC W/ AUTO DIFF
BASOPHILS ABSOLUTE COUNT: 0 10*9/L (ref 0.0–0.1)
BASOPHILS RELATIVE PERCENT: 0.6 %
EOSINOPHILS ABSOLUTE COUNT: 0.1 10*9/L (ref 0.0–0.5)
EOSINOPHILS RELATIVE PERCENT: 1.7 %
HEMATOCRIT: 42.2 % (ref 39.0–48.0)
HEMOGLOBIN: 14.7 g/dL (ref 12.9–16.5)
LYMPHOCYTES ABSOLUTE COUNT: 2.8 10*9/L (ref 1.1–3.6)
LYMPHOCYTES RELATIVE PERCENT: 35.2 %
MEAN CORPUSCULAR HEMOGLOBIN CONC: 34.8 g/dL (ref 32.0–36.0)
MEAN CORPUSCULAR HEMOGLOBIN: 28.9 pg (ref 25.9–32.4)
MEAN CORPUSCULAR VOLUME: 83.2 fL (ref 77.6–95.7)
MEAN PLATELET VOLUME: 7 fL (ref 6.8–10.7)
MONOCYTES ABSOLUTE COUNT: 0.7 10*9/L (ref 0.3–0.8)
MONOCYTES RELATIVE PERCENT: 9.2 %
NEUTROPHILS ABSOLUTE COUNT: 4.3 10*9/L (ref 1.8–7.8)
NEUTROPHILS RELATIVE PERCENT: 53.3 %
NUCLEATED RED BLOOD CELLS: 0 /100{WBCs} (ref <=4)
PLATELET COUNT: 446 10*9/L (ref 150–450)
RED BLOOD CELL COUNT: 5.07 10*12/L (ref 4.26–5.60)
RED CELL DISTRIBUTION WIDTH: 13 % (ref 12.2–15.2)
WBC ADJUSTED: 8.1 10*9/L (ref 3.6–11.2)

## 2022-05-29 LAB — ETHANOL: ETHANOL: <10 mg/dL (ref <=10)

## 2022-05-29 MED ORDER — METHOCARBAMOL 500 MG TABLET
ORAL_TABLET | Freq: Two times a day (BID) | ORAL | 0 refills | 10 days | Status: CP
Start: 2022-05-29 — End: 2022-06-08

## 2022-05-29 MED ADMIN — methocarbamol (ROBAXIN) tablet 500 mg: 500 mg | ORAL | @ 21:00:00 | Stop: 2022-05-29

## 2022-05-29 NOTE — Unmapped (Signed)
Pt states he thinks he blocked out and fell this morning. He unable to recall anything. C/o pain to back and rib cage.

## 2022-05-29 NOTE — Unmapped (Shared)
[facility]  Emergency Department Provider Note      ED Clinical Impression      Final diagnoses:   None          Impression, Medical Decision Making, Progress Notes and Critical Care      Impression, Differential Diagnosis and Plan of Care    Allen Harris is a 27 y.o. male with a past medical history of Crohn's disease who presents to the ED for evaluation after an unwitnessed syncopal episode around 11:00AM today with unknown head strike.     On exam, patient is well appearing and in no acute distress. Vital signs are within normal limits; patient is hemodynamically stable, afebrile. Exam is remarkable for tenderness to palpation of the right mid-scapular ribs, probably 6-10, without crepitus or deformity.     Differential includes ***    Plan to obtain EKG, CXR, and CT head, as well as basic labs, EtOH, UA, and UDS. Will treat patient with ***.     Independent Interpretation of Studies    I have independently interpreted the following studies:  EKG: {** time/tracing time/my interpretation **}  X-ray(s): {** time/study/my interpretation **}  CT/MRI(s): {** time/study/my interpretation **}    Discussion of Management with other Providers or Support Staff    I discussed the management of this patient with the:  Admitting provider: {** time/admitting service AND name of provider/planned inpatient workup & management **}  Consultant(s): {** time/consulting service AND name of provider/summary of discussion **}  Radiologist: {** time/name of radiologist/study discussed/summary of discussion **}  ED Pharmacist: {** time/name of pharmacist/summary of discussion **}  Case Management/Social Work: {** time/reason for [employer] of discussion **}  Other: {** time/who you spoke with/summary of discussion **}    Considerations Regarding Disposition/Escalation of Care and Critical Care    Indications for observation/admission (or consideration of observation/admission) and/or appropriateness for outpatient management: {** time/pertinent +/- test results, response to treatment, likely diagnosis(es) and factors supporting discharge or reasons admission is necessary **}  Patient/Family/Caregiver Discussions: {** time/understanding of plan of care, need for admission/discharge plan, code status discussions/decision to de-escalate care because of poor prognosis **}  Diagnostic Tests Considered But Not Done: ***  Prescription Drugs Provided or Considered But Not Given: {** Antibiotics, Antivirals, Pain medications, etc. **}  Social Determinants of Health which significantly affected care: N/A    Additional Progress Notes    {** .EDCOURSE or .TIMESTAMP/progress note **}    Portions of this record have been created using Scientist, clinical (histocompatibility and immunogenetics). Dictation errors have been sought, but may not have been identified and corrected.    See chart and resident provider documentation for details.  ___________________________________________     History     Reason for Visit  Syncope      HPI   Allen Harris is a 27 y.o. male with a past medical history of Crohn's disease who presents to the ED for evaluation of syncope. The patient reports that he remembers talking to a friend on the phone around 10:30AM, then woke suddenly on the ground about 30 minutes later, unable to recall the events of his fall. Unknown head strike. He is not anticoagulated. He has been able to ambulate since the incident. No history of syncopal episodes. Currently, he reports severe right-sided back pain at the posterior, inferior rib margins, which worsens with deep inspiration. He denies chest pain, shortness of breath, nausea, vomiting, abdominal pain, dizziness, or headache.     Outside Historian(s)  (EMS, Significant Other,  Family, Parent, Caregiver, Friend, Patent examiner, etc.)    Patient's father at bedside    External Records Reviewed  (Inpatient/Outpatient notes, Prior labs/imaging studies, Care Everywhere, PDMP, External ED notes, etc)    05/24/2022 GI note for patient's past medical history.    Past Medical History:   Diagnosis Date    Crohn's disease (CMS-HCC)     Elevated blood pressure reading 12/05/2016       Patient Active Problem List   Diagnosis    Adjustment disorder with anxiety    Anxiety    Crohn's disease of small intestine (CMS-HCC)    Crohn's disease of both small and large intestine without complication (CMS-HCC)       Past Surgical History:   Procedure Laterality Date    PR BREATH HYDROGEN TEST N/A 04/22/2014    Procedure: BREATH HYDROGEN TEST;  Surgeon: Nurse-Based Giproc;  [facility];  Service: Gastroenterology    PR COLONOSCOPY FLX DX W/COLLJ SPEC WHEN PFRMD Left 08/13/2021    Procedure: COLONOSCOPY, FLEXIBLE, PROXIMAL TO SPLENIC FLEXURE; DIAGNOSTIC, W/WO COLLECTION SPECIMEN BY BRUSH OR WASH;  Surgeon: Modena Nunnery, MD;  Location: [facility] PROCEDURES [facility];  Service: Gastroenterology    PR COLONOSCOPY W/BIOPSY SINGLE/MULTIPLE N/A 10/03/2012    Procedure: COLONOSCOPY, FLEXIBLE, PROXIMAL TO SPLENIC FLEXURE; WITH BIOPSY, SINGLE OR MULTIPLE;  Surgeon: Nat Christen, MD;  Location: PEDS PROCEDURE ROOM [facility];  Service: Gastroenterology    PR COLONOSCOPY W/BIOPSY SINGLE/MULTIPLE N/A 12/08/2014    Procedure: COLONOSCOPY, FLEXIBLE, PROXIMAL TO SPLENIC FLEXURE; WITH BIOPSY, SINGLE OR MULTIPLE;  Surgeon: Ciro Backer, MD;  Location: PEDS PROCEDURE ROOM [facility];  Service: Gastroenterology    PR SURG DIAGNOSTIC EXAM, ANORECTAL N/A 10/09/2012    Procedure: ANORECTAL EXAM, SURGICAL, REQUIRING ANESTHESIA (GENERAL, SPINAL, OR EPIDURAL), DIAGNOSTIC;  Surgeon: Bunnie Pion, MD;  Location: [facility];  Service: Pediatric Surgery    PR UPPER GI ENDOSCOPY,BIOPSY N/A 10/03/2012    Procedure: UGI ENDOSCOPY; WITH BIOPSY, SINGLE OR MULTIPLE;  Surgeon: Nat Christen, MD;  Location: PEDS PROCEDURE ROOM [facility];  Service: Gastroenterology    PR UPPER GI ENDOSCOPY,BIOPSY N/A 12/08/2014 Procedure: UGI ENDOSCOPY; WITH BIOPSY, SINGLE OR MULTIPLE;  Surgeon: Ciro Backer, MD;  Location: PEDS PROCEDURE ROOM [facility];  Service: Gastroenterology       No current facility-administered medications for this encounter.    Current Outpatient Medications:     empty container Misc, Use as directed to dispose of Stelara, Disp: 1 each, Rfl: 3    multivitamin (THERAGRAN) per tablet, Take 1 tablet by mouth daily., Disp: , Rfl:     STELARA 90 mg/mL Syrg syringe, Inject the contents of 1 syringe (90 mg total) under the skin every 8 weeks., Disp: 1 mL, Rfl: 1    Allergies  Codeine    Family History   Problem Relation Age of Onset    Other Mother        Social History  Social History     Tobacco Use    Smoking status: Never    Smokeless tobacco: Never   Substance Use Topics    Alcohol use: Not Currently     Comment: maybe once a month    Drug use: No        Physical Exam     This provider entered the patient's room: Yes:    If this provider did not enter the room, a comprehensive physical exam was not able to be performed due to increased infection risk to themselves,  other providers, staff and other patients), as well as to conserve personal protective equipment (PPE) utilization during the COVID-19 pandemic.    If this provider did enter the patient room, the following was PPE worn: Surgical mask, eye protection and gloves     ED Triage Vitals [05/29/22 1214]   Enc Vitals Group      BP 138/83      Heart Rate 87      SpO2 Pulse       Resp 16      Temp 36.7 ??C (98 ??F)      Temp Source Oral      SpO2 97 %     Constitutional: Alert and oriented. Well appearing and in no distress.  Eyes: Conjunctivae are normal.  ENT       Head: Normocephalic and atraumatic.       Nose: No congestion.       Mouth/Throat: Mucous membranes are moist.       Neck: No stridor.  Hematological/Lymphatic/Immunilogical: No cervical lymphadenopathy.  Cardiovascular: Normal rate, regular rhythm. Normal and symmetric distal pulses are present in all extremities.  Respiratory: Normal respiratory effort. Breath sounds are normal.  Gastrointestinal: Soft and nontender. There is no CVA tenderness.  Musculoskeletal: Normal range of motion in all extremities. There is tenderness to palpation of the right mid-scapular ribs, probably 6-10, without crepitus or deformity.        Right lower leg: No tenderness or edema.       Left lower leg: No tenderness or edema.  Neurologic: Normal speech and language. No gross focal neurologic deficits are appreciated.  Skin: Skin is warm, dry and intact. No rash noted.  Psychiatric: Mood and affect are normal. Speech and behavior are normal.    Labs     ***    Radiology     XR Chest 2 views    (Results Pending)   CT Head Wo Contrast    (Results Pending)        EKG     ***      Documentation assistance was provided by Johnathan Hausen, Scribe, on May 29, 2022 at 12:27 PM for Leim Fabry, MD.    {*** NOTE TO PROVIDER: PLEASE ADD EDPROVSCRIBEATTEST NOTING YOU AGREE WITH SCRIBE DOCUMENTATION}

## 2022-05-29 NOTE — Unmapped (Addendum)
I assumed care at 1500. I reviewed the patient's chart and interviewed the patient. Briefly, 27 y.o. male with history of Crohn's disease who presented after an unwitnessed syncopal episode around 11:00AM today with unknown headstrike. Obtained EKG, CXR, CT Head, basic labs, Utox, UA, and ethanol. CT Head and CXR unremarkable. Pending labs and reassessment.     Here ethanol is negative, CBC is unremarkable    EKG demonstrates sinus rhythm at 83 with rightward axis,     In the setting of syncope I considered :    1.  ACS: No ST changes  2.  Tachycardia-bradycardia: No blocks  3.  WPW: No delta wave  4.  Brugada: No RSR', right bundle appearance  5.  HCM: No LVH, needle Qs/T-wave inversions  6.  Short/long QT: 300<QTc<500, no family history  7.  Arrhythmogenic right ventricular dysplasia: No epsilon wave, no inverted Ts in the anterior precordium    None of these arrhythmias were present.    Patient had episode of  loss of consciousness without associated incontinence, or confusion.  Blood work and EKG are reassuring.    Chest x-ray is clear.    CT head negative    Patient be discharged home, rest, OTCs, follow-up primary care return for new symptoms, worsening symptoms or other concerns.    Documentation assistance was provided by Dyanne Carrel, Scribe on May 29, 2022 at 3:36 PM for Betsy Pries, MD.       Documentation assistance was provided by the scribe in my presence.  The documentation recorded by the scribe has been reviewed by me and accurately reflects the services I personally performed.      Note has been documented by Dyanne Carrel on 05/29/2022

## 2022-06-02 MED ORDER — ERGOCALCIFEROL (VITAMIN D2) 1,250 MCG (50,000 UNIT) CAPSULE
ORAL_CAPSULE | ORAL | 3 refills | 28 days | Status: CP
Start: 2022-06-02 — End: 2023-06-02

## 2022-06-23 DIAGNOSIS — K5080 Crohn's disease of both small and large intestine without complications: Principal | ICD-10-CM

## 2022-06-23 MED ORDER — STELARA 90 MG/ML SUBCUTANEOUS SYRINGE
SUBCUTANEOUS | 2 refills | 56 days | Status: CP
Start: 2022-06-23 — End: ?
  Filled 2022-07-06: qty 1, 56d supply, fill #0

## 2022-06-23 NOTE — Unmapped (Signed)
Refill request for Stelara sent to the pharmacy for 6 month supply. Pt up to date on appts.

## 2022-07-04 NOTE — Unmapped (Signed)
The [facility] has made a third and final attempt to reach this patient to refill the following medication:Stelara.      We have left voicemails on the following phone numbers: [phone] & [phone], have sent a text message to the following phone numbers: [phone], and have sent a Mychart questionnaire..    Dates contacted: 6/13, 6/18, and 6/24  Last scheduled delivery: 05/05/22 (56 day supply)    The patient may be at risk of non-compliance with this medication. The patient should call the [facility] at [phone]  Option 4, then Option 2: Dermatology, Gastroenterology, Rheumatology to refill medication.    Willette Pa   [facility] Specialty Technician

## 2022-07-04 NOTE — Unmapped (Signed)
[facility] Refill Coordination Note    Specialty Medication(s) to be Shipped:   Inflammatory Disorders: Stelara    Other medication(s) to be shipped: No additional medications requested for fill at this time     Allen Harris, DOB: [birth year]  Phone: [phone] (home)       All above HIPAA information was verified with patient's family member, Mom.     Was a Nurse, learning disability used for this call? No    Completed refill call assessment today to schedule patient's medication shipment from the [facility] [phone]).  All relevant notes have been reviewed.     Specialty medication(s) and dose(s) confirmed: Regimen is correct and unchanged.   Changes to medications: [name] reports no changes at this time.  Changes to insurance: No  New side effects reported not previously addressed with a pharmacist or physician: None reported  Questions for the pharmacist: No    Confirmed patient received a Conservation officer, historic buildings and a Surveyor, mining with first shipment. The patient will receive a drug information handout for each medication shipped and additional FDA Medication Guides as required.       DISEASE/MEDICATION-SPECIFIC INFORMATION        For patients on injectable medications: Patient currently has 0 doses left.  Next injection is scheduled for 6/28.    SPECIALTY MEDICATION ADHERENCE     Medication Adherence    Patient reported X missed doses in the last month: 0  Specialty Medication: STELARA 90 mg/mL Syrg syringe  Patient is on additional specialty medications: No  Informant: mother              Were doses missed due to medication being on hold? No    STELARA 90 mg/mL Syrg syringe : 0 days of medicine on hand       REFERRAL TO PHARMACIST     Referral to the pharmacist: Not needed      [facility]     Shipping address confirmed in Epic.       Delivery Scheduled: Yes, Expected medication delivery date: 6/26.     Medication will be delivered via Same Day Courier to the prescription address in Epic WAM.    [name]   Specialty [facility] Specialty Technician

## 2022-07-28 ENCOUNTER — Ambulatory Visit
Admit: 2022-07-28 | Discharge: 2022-07-29 | Payer: PRIVATE HEALTH INSURANCE | Attending: Student in an Organized Health Care Education/Training Program | Primary: Student in an Organized Health Care Education/Training Program

## 2022-07-28 DIAGNOSIS — L918 Other hypertrophic disorders of the skin: Principal | ICD-10-CM

## 2022-07-28 DIAGNOSIS — D492 Neoplasm of unspecified behavior of bone, soft tissue, and skin: Principal | ICD-10-CM

## 2022-07-28 NOTE — Unmapped (Signed)
[facility] releases most results to you as soon as they are available. Therefore, you may see some results before we do. Please give [place] 3 business days to review the tests and contact you by phone or through MyChart. If you are concerned that some results may be upsetting or confusing, you may wish to wait until we contact you before looking at the report in MyChart.   If you have an urgent question, you can call our clinic. MyChart should not be used for urgent issues. Otherwise, we prefer that you wait 3 business days for [place] to contact you.    [facility] Dermatology Clinical Team      Cryosurgery  Cryosurgery (???freezing???) uses liquid nitrogen to destroy certain types of skin lesions. Lowering the temperature of the lesion in a small area surrounding skin destroys the lesion. Immediately following cryosurgery, you will notice redness and swelling of the treatment area. Blistering or weeping may occur, lasting approximately one week which will then be followed by crusting. Most areas will heal completely in 10 to 14 days.    Wash the treated areas daily. Allow soap and water to run over the areas, but do not scrub. Should a scab or crust form, allow it to fall off on its own. Do not remove or pick at it. Application of an ointment  and a bandage may make you feel more comfortable, but it is not necessary. Some people develop an allergy to Neosporin, so we recommend that Vaseline or  Aquaphor be used.    The cryotherapy site will be more sensitive than your surrounding skin. Keep it covered, and remember to apply sunscreen every day to all your sun exposed skin. A scar may remain which is lighter or pinker than your normal skin. Your body will continue to improve your scar for up to one year; however a light-colored scar may remain.    Infection following cryotherapy is rare. However if you are worried about the appearance of the treated area, contact your doctor. We have a physician on call at all times. If you have any concerns about the site, please call our clinic at [phone]     Shave biopsy   A shave biopsy involves numbing a small area of your skin and then obtaining a sample to help [place] with proper diagnosis or skin condition. Biopsy results typically return in 7 to 14 days.    To care for the area: Leave the bandage in place until the morning after your procedure is performed. On a daily basis, carefully remove the bandage, then shower or wash as usual. Allow water to run over the site. Please do not scrub. Carefully dry the area, then apply ointment (some people develop an allergy to Neosporin, so we recommend Vaseline orAquaphor). Cover the site with a fresh bandage. Should any bleeding occur, apply firm pressure for 15 minutes. The treated site will heal best if  a scab never forms (the wound heals by new skin cells traveling from the outside toward the middle-their journey is easier if no scab stands in their way).    Long-term care: the site will be more sensitive than your surrounding skin. Keep it covered, and remember to apply sunscreen every day to all your exposed skin. A scar may remain which is lighter or pinker than your normal skin. Your body will continue to improve your scar for up to one year.    Infection following this procedure is rare. However, if you are worried about  the appearance of your site, contact your doctor. Complete healing may take up to one month. We have a physician on call at all times. If you have any concerns about the site, please call our clinic at [phone]

## 2022-07-28 NOTE — Unmapped (Signed)
Dermatology Note     Assessment and Plan:      Neoplasm(ia) of unspecified etiology:  - To help confirm diagnosis, biopsy/biopsies obtained today.   Biopsy (Shave) Procedure Note:   After R/B/A discussed (including scarring, pigment alteration, recurrence, or persistence of the lesion) and consent was obtained, the area was marked and photographed. Time Out verification of patient, procedure, and site was performed. When applicable, safety precautions based on patient's medical history or medication use and review of relevant images and results was also performed as part of the Time Out. Site was then prepped with alcohol, and anesthetized with lidocaine 2% with epinephrine. Biopsy(ies) performed using a shave technique. Hemostasis was achieved with pressure, aluminum chloride, Monsel's, and/or electrocautery. Area was dressed with petrolatum and bandage. Wound care instructions were provided. We will contact the patient with results when available. Patient agrees to be notified of results by MyChart - even if needs further management.   A) Location: L cheek, DDx: BCC vs SCC vs AK vs KA vs SK vs PG vs angoma    Acrochordon of the L posterior neck- elective, cosmetic removal  - The patient was counseled that due to the benign and non-symptomatic nature of these lesions, removal is considered cosmetic in nature and insurance accordingly does not cover these treatments.    - After discussion of treatment options including cryotherapy vs snip removal, patient elected to proceed with cryotherapy today. Patient understands cosmetic charge ($50 for the first lesion, then $10 for each additional lesion) and signed cosmetic consent form today.     Cosmetic fee collected ($50). DNBI.    Cryotherapy procedure note:  After R/B/A discussed (including scarring, pigment alteration, recurrence, or persistence of the lesion) and verbal consent was obtained, 1 lesions most bothersome to the patient were identified and treated with liquid nitrogen cryotherapy x 2 10-second freeze-thaw cycle. The patient tolerated the procedure well and was instructed on post-procedure care.    Number treated: 1    The patient was advised to call for an appointment should any new, changing, or symptomatic lesions develop.     RTC: Return for pending path. or sooner as needed   _________________________________________________________________      Chief Complaint     Chief Complaint   Patient presents with    Lesion Of Concern     Spot on left cheek       HPI     Allen Harris is a 27 y.o. male who presents as a new patient to Dermatology for a lesion of concern.     LOC  - Patient notes a red lesion on his left cheek   - Present for a month  - Endorses occasional bleeding and initial growth    LOC  - Notes bothersome lesion on back of neck  - Endorses irritation with clothing    The patient denies any other new or changing lesions or areas of concern.     Pertinent Past Medical History     No history of skin cancer    Chron's Disease    Family History:   Unknown     Past Medical History, Family History, Social History, Medication List, Allergies, and Problem List were reviewed in the rooming section of Epic.     ROS: Other than symptoms mentioned in the HPI, no fevers, chills, or other skin complaints    Physical Examination     GENERAL: Well-appearing male in no acute distress, resting comfortably.  NEURO: Alert and oriented,  answers questions appropriately  PSYCH: Normal mood and affect  SKIN: Examination of the face, neck, and back was performed  - Acrochordon(s): soft, fleshy, skin-colored to tan pedunculated papules located on the L posterior neck  - concerning lesions biopsied as described below:      Site (Biopsy A): Cheek, left   Differential (Biopsy A): Neoplasia - Uncertain etiology   Neoplasia (Biopsy A): BCC vs. SCC vs. AK vs. KA vs. SK vs. PG vs. Angioma  Size of Lesion (Biopsy A): 2 mm   Triangulation (Biopsy A): n/a   Type of biopsy (Biopsy A): Shave   Description (Biopsy A): vascular papule     All areas not commented on are within normal limits or unremarkable    Scribe's Attestation: Gentry Fitz Robynn Pane) Natasha Bence, PA-C obtained and performed the history, physical exam and medical decision making elements that were entered into the chart. Signed by Donnella Sham, Scribe, on July 28, 2022 at 9:55 AM.    ----------------------------------------------------------------------------------------------------------------------  July 28, 2022 12:51 PM. Documentation assistance provided by the Scribe. I was present during the time the encounter was recorded. The information recorded by the Scribe was done at my direction and has been reviewed and validated by me.  ----------------------------------------------------------------------------------------------------------------------      (Approved Template 09/23/2019)

## 2022-08-29 NOTE — Unmapped (Signed)
[facility] Refill Coordination Note    Allen Harris, DOB: 1995-03-05  Phone: [phone] (home)       All above HIPAA information was verified with patient.         08/29/2022     9:48 AM   Specialty Rx Medication Refill Questionnaire   Which Medications would you like refilled and shipped? Stelara   Please list all current allergies: codeine   Have you missed any doses in the last 30 days? No   Have you had any changes to your medication(s) since your last refill? No   How many days remaining of each medication do you have at home? 0   If receiving an injectable medication, next injection date is 09/02/2022   Have you experienced any side effects in the last 30 days? No   Please enter the full address (street address, city, state, zip code) where you would like your medication(s) to be delivered to. [address], [place] [address]   Please specify on which day you would like your medication(s) to arrive. Note: if you need your medication(s) within 3 days, please call the pharmacy to schedule your order at [phone]  08/31/2022   Has your insurance changed since your last refill? No   Would you like a pharmacist to call you to discuss your medication(s)? No   Do you require a signature for your package? (Note: if we are billing Medicare Part B or your order contains a controlled substance, we will require a signature) No         Completed refill call assessment today to schedule patient's medication shipment from the [facility] [phone]).  All relevant notes have been reviewed.       Confirmed patient received a Conservation officer, historic buildings and a Surveyor, mining with first shipment. The patient will receive a drug information handout for each medication shipped and additional FDA Medication Guides as required.         REFERRAL TO PHARMACIST     Referral to the pharmacist: Not needed      [facility]     Shipping address confirmed in Epic.     Delivery Scheduled: Yes, Expected medication delivery date: 08/31/22.     Medication will be delivered via Same Day Courier to the prescription address in Epic [place].    Willette Pa   [facility] Specialty Technician

## 2022-08-31 MED FILL — STELARA 90 MG/ML SUBCUTANEOUS SYRINGE: SUBCUTANEOUS | 56 days supply | Qty: 1 | Fill #1

## 2022-10-24 NOTE — Unmapped (Signed)
[facility] Refill Coordination Note    Specialty Medication(s) to be Shipped:   Inflammatory Disorders: Stelara    Other medication(s) to be shipped: No additional medications requested for fill at this time     Allen Harris, DOB: [birth year]  Phone: [phone] (home)       All above HIPAA information was verified with patient's family member, Allen Harris.     Was a Nurse, learning disability used for this call? No    Completed refill call assessment today to schedule patient's medication shipment from the [facility]  [phone]).  All relevant notes have been reviewed.     Specialty medication(s) and dose(s) confirmed: Regimen is correct and unchanged.   Changes to medications: [name] reports no changes at this time.  Changes to insurance: No  New side effects reported not previously addressed with a pharmacist or physician: None reported  Questions for the pharmacist: No    Confirmed patient received a Conservation officer, historic buildings and a Surveyor, mining with first shipment. The patient will receive a drug information handout for each medication shipped and additional FDA Medication Guides as required.       DISEASE/MEDICATION-SPECIFIC INFORMATION        For patients on injectable medications: Patient currently has 0 doses left.  Next injection is scheduled for 10/18.    SPECIALTY MEDICATION ADHERENCE     Medication Adherence    Patient reported X missed doses in the last month: 0  Specialty Medication: STELARA 90 mg/mL  Patient is on additional specialty medications: No              Were doses missed due to medication being on hold? No    Stelara 90 mg/ml: 0 doses of medicine on hand        REFERRAL TO PHARMACIST     Referral to the pharmacist: Not needed      [facility]     Shipping address confirmed in Epic.       Delivery Scheduled: Yes, Expected medication delivery date: 10/27/22.     Medication will be delivered via UPS to the prescription address in Epic W[facility].    Allen Harris   [facility]  Specialty Technician

## 2022-10-26 MED FILL — STELARA 90 MG/ML SUBCUTANEOUS SYRINGE: SUBCUTANEOUS | 56 days supply | Qty: 1 | Fill #2

## 2022-12-16 DIAGNOSIS — K5080 Crohn's disease of both small and large intestine without complications: Principal | ICD-10-CM

## 2022-12-16 MED ORDER — STELARA 90 MG/ML SUBCUTANEOUS SYRINGE
SUBCUTANEOUS | 2 refills | 56 days | Status: CP
Start: 2022-12-16 — End: ?
  Filled 2022-12-21: qty 1, 56d supply, fill #0

## 2022-12-16 NOTE — Unmapped (Signed)
Encounter for refill request:  Last clinic visit: 05/24/2022  Colonoscopy:     Lab Results   Component Value Date    WBC 8.1 05/29/2022    RBC 5.07 05/29/2022    HGB 14.7 05/29/2022    HCT 42.2 05/29/2022    PLT 446 05/29/2022    ALT 17 05/29/2022    AST 14 05/29/2022    ALKPHOS 110 05/29/2022    CRP <4.0 11/30/2021    CREATININE 0.74 05/29/2022       Stelara refills authorized x 6 month supply. Per Dr. Zachery Dauer, pt to follow up in 6-12 months.

## 2022-12-20 NOTE — Unmapped (Signed)
[facility] Refill Coordination Note    Allen Harris, DOB: [birth year]  Phone: [phone] (home)       All above HIPAA information was verified with patient.         12/20/2022     9:31 AM   Specialty Rx Medication Refill Questionnaire   Which Medications would you like refilled and shipped? Stelara   Please list all current allergies: Codeine   Have you missed any doses in the last 30 days? No   Have you had any changes to your medication(s) since your last refill? No   How many days remaining of each medication do you have at home? 0   If receiving an injectable medication, next injection date is 12/23/2022   Have you experienced any side effects in the last 30 days? No   Please enter the full address (street address, city, state, zip code) where you would like your medication(s) to be delivered to. [address] [place], [place] [address]   Please specify on which day you would like your medication(s) to arrive. Note: if you need your medication(s) within 3 days, please call the pharmacy to schedule your order at [phone]  12/22/2022   Has your insurance changed since your last refill? No   Would you like a pharmacist to call you to discuss your medication(s)? No   Do you require a signature for your package? (Note: if we are billing Medicare Part B or your order contains a controlled substance, we will require a signature) No         Completed refill call assessment today to schedule patient's medication shipment from the [facility] [phone]).  All relevant notes have been reviewed.       Confirmed patient received a Conservation officer, historic buildings and a Surveyor, mining with first shipment. The patient will receive a drug information handout for each medication shipped and additional FDA Medication Guides as required.         REFERRAL TO PHARMACIST     Referral to the pharmacist: Not needed      [facility]     Shipping address confirmed in Epic.     Delivery Scheduled: Yes, Expected medication delivery date: 12/21/22.     Medication will be delivered via UPS to the prescription address in Epic WAM.    Willette Pa   [facility] Pharm[facility] Specialty Technician

## 2023-02-15 DIAGNOSIS — K5080 Crohn's disease of both small and large intestine without complications: Principal | ICD-10-CM

## 2023-02-16 NOTE — Unmapped (Signed)
[facility] and [facility] Delivery Pharm[facility] Refill Coordination Note    Allen Harris, DOB: 07/08/1995  Phone: [phone] (home)       All above HIPAA information was verified with patient.         02/15/2023     6:40 PM   Specialty Rx Medication Refill Questionnaire   Which Medications would you like refilled and shipped? Stellara   Please list all current allergies: Codeine   Have you missed any doses in the last 30 days? No   Have you had any changes to your medication(s) since your last refill? No   How many days remaining of each medication do you have at home? 0   If receiving an injectable medication, next injection date is 02/17/2023   Have you experienced any side effects in the last 30 days? No   Please enter the full address (street address, city, state, zip code) where you would like your medication(s) to be delivered to. [address] [place], [place] [address]   Please specify on which day you would like your medication(s) to arrive. Note: if you need your medication(s) within 3 days, please call the pharmacy to schedule your order at [phone]  02/17/2023   Has your insurance changed since your last refill? No   Would you like a pharmacist to call you to discuss your medication(s)? No   Do you require a signature for your package? (Note: if we are billing Medicare Part B or your order contains a controlled substance, we will require a signature) No         Completed refill call assessment today to schedule patient's medication shipment from the [facility] [phone]).  All relevant notes have been reviewed.       Confirmed patient received a Conservation officer, historic buildings and a Surveyor, mining with first shipment. The patient will receive a drug information handout for each medication shipped and additional FDA Medication Guides as required.         REFERRAL TO PHARMACIST     Referral to the pharmacist: Not needed      [facility]     Shipping address confirmed in Epic.     Delivery Scheduled: Yes, Expected medication delivery date: 02/21/23.     Medication will be delivered via UPS to the prescription address in Epic WAM.    Willette Pa   [facility] Delivery Pharm[facility] Specialty Technician

## 2023-02-20 MED FILL — STELARA 90 MG/ML SUBCUTANEOUS SYRINGE: SUBCUTANEOUS | 56 days supply | Qty: 1 | Fill #1

## 2023-04-07 NOTE — Unmapped (Signed)
 [facility] Delivery Pharm[facility] Clinical Assessment & Refill Coordination Note    Allen Harris, DOB: [birth year]  Phone: [phone] (home)     All above HIPAA information was verified with patient's family member, mom.     Was a Nurse, learning disability used for this call? No    Specialty Medication(s):   Inflammatory Disorders: Stelara     Current Outpatient Medications   Medication Sig Dispense Refill    empty container Misc Use as directed to dispose of Stelara 1 each 3    ergocalciferol-1,250 mcg, 50,000 unit, (DRISDOL) 1,250 mcg (50,000 unit) capsule Take 1 capsule (1,250 mcg total) by mouth once a week. 4 capsule 3    multivitamin (THERAGRAN) per tablet Take 1 tablet by mouth daily.      STELARA 90 mg/mL Syrg syringe Inject the contents of 1 syringe (90 mg total) under the skin every 8 weeks. 1 mL 2     No current facility-administered medications for this visit.        Changes to medications: Horton reports no changes at this time.    Medication list has been reviewed and updated in Epic: Yes    Allergies   Allergen Reactions    Codeine Nausea Only, Swelling and Nausea And Vomiting       Changes to allergies: No    Allergies have been reviewed and updated in Epic: Yes    SPECIALTY MEDICATION ADHERENCE     Stelara 90 mg/ml: 0 doses of medicine on hand     Medication Adherence    Patient reported X missed doses in the last month: 0  Specialty Medication: Stelara 90mg /mL  Patient is on additional specialty medications: No  Informant: patient          Specialty medication(s) dose(s) confirmed: Regimen is correct and unchanged.     Are there any concerns with adherence? No    Adherence counseling provided? Not needed    CLINICAL MANAGEMENT AND INTERVENTION      Clinical Benefit Assessment:    Do you feel the medicine is effective or helping your condition? Yes    Clinical Benefit counseling provided? Not needed    Adverse Effects Assessment:    Are you experiencing any side effects? No    Are you experiencing difficulty administering your medicine? No    Quality of Life Assessment:    Quality of Life    Rheumatology  Oncology  Dermatology  Cystic Fibrosis          How many days over the past month did your Crohn's  keep you from your normal activities? For example, brushing your teeth or getting up in the morning. 0    Have you discussed this with your provider? Not needed    Acute Infection Status:    Acute infections noted within Epic:  No active infections    Patient reported infection: None    Therapy Appropriateness:    Is therapy appropriate based on current medication list, adverse reactions, adherence, clinical benefit and progress toward achieving therapeutic goals? Yes, therapy is appropriate and should be continued     Clinical Intervention:    Was an intervention completed as part of this clinical assessment? No    DISEASE/MEDICATION-SPECIFIC INFORMATION      For patients on injectable medications: Patient currently has 0 doses left.  Next injection is scheduled for 4/4.    Chronic Inflammatory Diseases: Have you experienced any flares in the last month? No  Has this been reported to  your provider? Not applicable    PATIENT SPECIFIC NEEDS     Does the patient have any physical, cognitive, or cultural barriers? No    Is the patient high risk? No    Does the patient require physician intervention or other additional services (i.e., nutrition, smoking cessation, social work)? No    Does the patient have an additional or emergency contact listed in their chart? Yes    SOCIAL DETERMINANTS OF HEALTH     At the [facility], we have learned that life circumstances - like trouble affording food, housing, utilities, or transportation can affect the health of many of our patients.   That is why we wanted to ask: are you currently experiencing any life circumstances that are negatively impacting your health and/or quality of life? No    Social Drivers of Health     Food Insecurity: Not on file   Tobacco Use: Low Risk  (07/28/2022)    Patient History     Smoking Tobacco Use: Never     Smokeless Tobacco Use: Never     Passive Exposure: Not on file   Transportation Needs: Not on file   Alcohol Use: Not on file   Housing: Not on file   Physical Activity: Not on file   Utilities: Not on file   Stress: Not on file   Interpersonal Safety: Not on file   Substance Use: Not on file (11/16/2022)   Intimate Partner Violence: Not on file   Social Connections: Not on file   Financial Resource Strain: Not on file   Depression: Not at risk (07/16/2021)    Received from [facility]    PHQ-2     (OBSOLETE) Total Prescreening Score: 0   Internet Connectivity: Not on file   Health Literacy: Not on file       Would you be willing to receive help with any of the needs that you have identified today? Not applicable       SHIPPING     Specialty Medication(s) to be Shipped:   Inflammatory Disorders: Stelara    Other medication(s) to be shipped: No additional medications requested for fill at this time     Changes to insurance: No    Cost and Payment: Patient has a copay of $5. They are aware and have authorized the pharmacy to charge the credit card on file.    Delivery Scheduled: Yes, Expected medication delivery date: 4/1.     Medication will be delivered via Same Day Courier to the confirmed prescription address in [facility].    The patient will receive a drug information handout for each medication shipped and additional FDA Medication Guides as required.  Verified that patient has previously received a Conservation officer, historic buildings and a Surveyor, mining.    The patient or caregiver noted above participated in the development of this care plan and knows that they can request review of or adjustments to the care plan at any time.      All of the patient's questions and concerns have been addressed.    Teofilo Pod, PharmD   [facility] Delivery Pharm[facility] Specialty Pharmacist

## 2023-04-11 MED FILL — STELARA 90 MG/ML SUBCUTANEOUS SYRINGE: SUBCUTANEOUS | 56 days supply | Qty: 1 | Fill #2

## 2023-05-24 DIAGNOSIS — K5080 Crohn's disease of both small and large intestine without complications: Principal | ICD-10-CM

## 2023-05-24 MED ORDER — STELARA 90 MG/ML SUBCUTANEOUS SYRINGE
SUBCUTANEOUS | 2 refills | 56.00000 days
Start: 2023-05-24 — End: ?

## 2023-05-25 MED ORDER — STELARA 90 MG/ML SUBCUTANEOUS SYRINGE
SUBCUTANEOUS | 0 refills | 56.00000 days | Status: CP
Start: 2023-05-25 — End: ?
  Filled 2023-06-07: qty 1, 56d supply, fill #0

## 2023-05-25 NOTE — Unmapped (Signed)
 Encounter for refill request:  Last clinic visit: 05/24/2022  Colonoscopy:     Lab Results   Component Value Date    WBC 8.1 05/29/2022    RBC 5.07 05/29/2022    HGB 14.7 05/29/2022    HCT 42.2 05/29/2022    PLT 446 05/29/2022    ALT 17 05/29/2022    AST 14 05/29/2022    ALKPHOS 110 05/29/2022    CRP <4.0 11/30/2021    CREATININE 0.74 05/29/2022       Stelara refills authorized x 1. Due for labs and appt, GIS messaged to schedule. Messaged pt with request.

## 2023-05-30 NOTE — Unmapped (Signed)
 The [facility] has made a second and final attempt to reach this patient to refill the following medication:Stelara.      We have left voicemails on the following phone numbers: [phone], have sent a MyChart message, have sent a text message to the following phone numbers: [phone], and have sent a Mychart questionnaire..    Dates contacted: 5/14 & 5/20  Last scheduled delivery: 04/10/23 (56 day supply)    The patient may be at risk of non-compliance with this medication. The patient should call the [facility] at [phone]  Option 4, then Option 2: Dermatology, Gastroenterology, Rheumatology to refill medication.    Canary Ceo   [facility] Special[facility] and Home Delivery Pharmacy Specialty Technician

## 2023-06-06 NOTE — Unmapped (Signed)
 [facility] Delivery Pharm[facility] Refill Coordination Note    Specialty Medication(s) to be Shipped:   Inflammatory Disorders: Stelara    Other medication(s) to be shipped: No additional medications requested for fill at this time     Allen Harris, DOB: May 01, 1995  Phone: [phone] (home)       All above HIPAA information was verified with patient.     Was a Nurse, learning disability used for this call? No    Completed refill call assessment today to schedule patient's medication shipment from the [facility]  [phone]).  All relevant notes have been reviewed.     Specialty medication(s) and dose(s) confirmed: Regimen is correct and unchanged.   Changes to medications: [name] reports no changes at this time.  Changes to insurance: No  New side effects reported not previously addressed with a pharmacist or physician: None reported  Questions for the pharmacist: No    Confirmed patient received a Conservation officer, historic buildings and a Surveyor, mining with first shipment. The patient will receive a drug information handout for each medication shipped and additional FDA Medication Guides as required.       DISEASE/MEDICATION-SPECIFIC INFORMATION        For patients on injectable medications: Patient currently has 0 doses left.  Next injection is scheduled for 06/09/23.    SPECIALTY MEDICATION ADHERENCE     Medication Adherence    Patient reported X missed doses in the last month: 0  Specialty Medication: STELARA 90 mg/mL Syrg syringe (ustekinumab)  Patient is on additional specialty medications: No              Were doses missed due to medication being on hold? No    STELARA 90 mg/mL Syrg syringe (ustekinumab): 0 doses of medicine on hand       REFERRAL TO PHARMACIST     Referral to the pharmacist: Not needed      [facility]     Shipping address confirmed in Epic.     Cost and Payment: Patient has a $0 copay, payment information is not required.    Delivery Scheduled: Yes, Expected medication delivery date: 06/07/23.     Medication will be delivered via Same Day Courier to the prescription address in Epic WAM.    Allen Harris   [facility] and [facility] Delivery Pharm[facility]  Specialty Technician

## 2023-08-02 DIAGNOSIS — K5080 Crohn's disease of both small and large intestine without complications: Principal | ICD-10-CM

## 2023-08-02 MED ORDER — STELARA 90 MG/ML SUBCUTANEOUS SYRINGE
SUBCUTANEOUS | 0 refills | 56.00000 days
Start: 2023-08-02 — End: ?

## 2023-08-02 NOTE — Unmapped (Signed)
 [facility] Delivery Pharm[facility] Refill Coordination Note    Allen Harris, DOB: 23-Jun-1995  Phone: [phone] (home)       All above HIPAA information was verified with patient.         08/02/2023    11:07 AM   Specialty Rx Medication Refill Questionnaire   Which Medications would you like refilled and shipped? stelara     Please list all current allergies: codeine    Have you missed any doses in the last 30 days? No    Have you had any changes to your medication(s) since your last refill? No    How much of each medication do you have remaining at home? (eg. number of tablets, injections, etc.) none    If receiving an injectable medication, next injection date is 08/04/2023    Have you experienced any side effects in the last 30 days? No    Please enter the full address (street address, city, state, zip code) where you would like your medication(s) to be delivered to. [address] [place], [place] [address]    Please specify on which day you would like your medication(s) to arrive. Note: if you need your medication(s) within 3 days, please call the pharmacy to schedule your order at [phone]  08/03/2023    Has your insurance changed since your last refill? No    Would you like a pharmacist to call you to discuss your medication(s)? No    Do you require a signature for your package? (Note: if we are billing Medicare Part B or your order contains a controlled substance, we will require a signature) No    I have been provided my out of pocket cost for my medication and approve the pharmacy to charge the amount to my credit card on file. Yes        Proxy-reported         Completed refill call assessment today to schedule patient's medication shipment from the [facility] Delivery Pharm[facility] [phone]).  All relevant notes have been reviewed.       Confirmed patient received a Conservation officer, historic buildings and a Surveyor, mining with first shipment. The patient will receive a drug information handout for each medication shipped and additional FDA Medication Guides as required.         REFERRAL TO PHARMACIST     Referral to the pharmacist: Not needed      [facility]     Shipping address confirmed in Epic.     Delivery Scheduled: Yes, Expected medication delivery date: 08/03/23.  However, Rx request for refills was sent to the provider as there are none remaining.     Medication will be delivered via Same Day Courier to the prescription address in Epic [place].    Kelly CHRISTELLA Eagles   [facility] Special[facility] and [facility] Delivery Pharmacy Specialty Technician

## 2023-08-03 MED ORDER — STELARA 90 MG/ML SUBCUTANEOUS SYRINGE
SUBCUTANEOUS | 0 refills | 56.00000 days | Status: CP
Start: 2023-08-03 — End: ?
  Filled 2023-08-03: qty 1, 56d supply, fill #0

## 2023-08-03 NOTE — Unmapped (Signed)
 Encounter for refill request:  Last clinic visit: 05/24/22  Colonoscopy:   Appointments which have been scheduled for you      Sep 04, 2023 3:00 PM  (Arrive by 2:45 PM)  RETURN IBD with [place] Jama Das, MD  [facility] [place] [facility]) [address]  [facility] [address]  [place] [address]  [phone]             Lab Results   Component Value Date    WBC 8.1 05/29/2022    RBC 5.07 05/29/2022    HGB 14.7 05/29/2022    HCT 42.2 05/29/2022    PLT 446 05/29/2022    ALT 17 05/29/2022    AST 14 05/29/2022    ALKPHOS 110 05/29/2022    CRP <4.0 11/30/2021    CREATININE 0.74 05/29/2022       Stelara  refills authorized x 1. Will obtain labs at upcoming appointment.

## 2023-09-04 ENCOUNTER — Ambulatory Visit: Admit: 2023-09-04 | Discharge: 2023-09-04 | Payer: BLUE CROSS/BLUE SHIELD

## 2023-09-04 ENCOUNTER — Encounter: Admit: 2023-09-04 | Discharge: 2023-09-04 | Payer: BLUE CROSS/BLUE SHIELD

## 2023-09-04 DIAGNOSIS — K5080 Crohn's disease of both small and large intestine without complications: Principal | ICD-10-CM

## 2023-09-04 LAB — COMPREHENSIVE METABOLIC PANEL
ALBUMIN: 4.6 g/dL (ref 3.4–5.0)
ALKALINE PHOSPHATASE: 103 U/L (ref 46–116)
ALT (SGPT): 27 U/L (ref 10–49)
ANION GAP: 12 mmol/L (ref 5–14)
AST (SGOT): 17 U/L (ref <=34)
BILIRUBIN TOTAL: 0.3 mg/dL (ref 0.3–1.2)
BLOOD UREA NITROGEN: 12 mg/dL (ref 9–23)
BUN / CREAT RATIO: 15
CALCIUM: 9.9 mg/dL (ref 8.7–10.4)
CHLORIDE: 101 mmol/L (ref 98–107)
CO2: 27.7 mmol/L (ref 20.0–31.0)
CREATININE: 0.81 mg/dL (ref 0.73–1.18)
EGFR CKD-EPI (2021) MALE: >90 mL/min/1.73m2 (ref >=60)
GLUCOSE RANDOM: 96 mg/dL (ref 70–179)
POTASSIUM: 3.9 mmol/L (ref 3.4–4.8)
PROTEIN TOTAL: 8.1 g/dL (ref 5.7–8.2)
SODIUM: 141 mmol/L (ref 135–145)

## 2023-09-04 LAB — CBC
HEMATOCRIT: 43.2 % (ref 39.0–48.0)
HEMOGLOBIN: 14.9 g/dL (ref 12.9–16.5)
MEAN CORPUSCULAR HEMOGLOBIN CONC: 34.5 g/dL (ref 32.0–36.0)
MEAN CORPUSCULAR HEMOGLOBIN: 28.3 pg (ref 25.9–32.4)
MEAN CORPUSCULAR VOLUME: 82 fL (ref 77.6–95.7)
MEAN PLATELET VOLUME: 7 fL (ref 6.8–10.7)
PLATELET COUNT: 441 10*9/L (ref 150–450)
RED BLOOD CELL COUNT: 5.27 10*12/L (ref 4.26–5.60)
RED CELL DISTRIBUTION WIDTH: 12.7 % (ref 12.2–15.2)
WBC ADJUSTED: 9.1 10*9/L (ref 3.6–11.2)

## 2023-09-04 LAB — IRON & TIBC
IRON SATURATION: 26 % (ref 20–55)
IRON: 86 ug/dL (ref 65–175)
TOTAL IRON BINDING CAPACITY: 333 ug/dL (ref 250–425)

## 2023-09-04 LAB — FERRITIN: FERRITIN: 69.4 ng/mL (ref 10.5–307.3)

## 2023-09-04 LAB — C-REACTIVE PROTEIN: C-REACTIVE PROTEIN: <5 mg/L (ref <=10.0)

## 2023-09-04 NOTE — Unmapped (Signed)
 [facility]  INFLAMMATORY BOWEL DISEASE  09/04/2023    Demographics:  Allen Harris is a 28 y.o. year old male    Referring physician:   Diedra Jerona Ruts, MD  [address] [facility]  [place],  [place] [address]          HPI / NOTE :   Allen Harris is a 28 y.o. man with PMH significant for ileocolonic CD. Overall, he is doingn well at this time. He averages 1-2 BM per day. He denies abdominal pain, urgency, frequency, and EIM of IBD. He has no other complaints at this time.           IBD HISTORY:     Year of disease onset: 2014  Diagnosis:  Crohn's Disease  Age at onset:   < 59 yr old (A1)  Location:  Ileocolonic (L3)  Behavior:  Nonstricturing,nonpenetrating (B1)  Perianal Dz:  Yes    Brief IBD Disease Course:    Diagnosed in high school. Has been in clinical remission on ustekinumab  and pt is happy with disease control.     Endoscopy:      Colonoscopy 08/2021:   - The examined portion of the ileum was normal.  - The entire examined colon is normal.  - No specimens collected. Crohn's disease in endoscopic remission. (SES-CD=0)    Colonoscopy 12/08/2014:   - Perianal fistula found on perianal exam. Mild in severity.  - Mildly ulcerated mucosa in the recto-sigmoid colon. Biopsied.  - Moderately ulcerated mucosa in the terminal ileum. Biopsied.    EGD 12/08/2014:  - Moderately severe chronic esophagitis. Biopsied.  - Chronic gastritis. Biopsied.  - Normal examined duodenum. Biopsied.    Imaging:    MRI 2016  IMPRESSION:  Mild terminal ileitis. No evidence of abscess, stricture, or fistula. The appearance is much less severe than 2014.    Prior IBD medications (type, dose, duration, response):  Mercaptopurine   ustekinumab           Past Medical History:   Past medical history:   Past Medical History:   Diagnosis Date    Crohn's disease         Elevated blood pressure reading 12/05/2016     Past surgical history:   Past Surgical History:   Procedure Laterality Date    PR BREATH HYDROGEN TEST N/A 04/22/2014    Procedure: BREATH HYDROGEN TEST;  Surgeon: Nurse-Based Giproc;  [facility];  Service: Gastroenterology    PR COLONOSCOPY FLX DX W/COLLJ SPEC WHEN PFRMD Left 08/13/2021    Procedure: COLONOSCOPY, FLEXIBLE, PROXIMAL TO SPLENIC FLEXURE; DIAGNOSTIC, W/WO COLLECTION SPECIMEN BY BRUSH OR WASH;  Surgeon: Mikle Cleora Martinez, MD;  Location: GI PROCEDURES [facility];  Service: Gastroenterology    PR COLONOSCOPY W/BIOPSY SINGLE/MULTIPLE N/A 10/03/2012    Procedure: COLONOSCOPY, FLEXIBLE, PROXIMAL TO SPLENIC FLEXURE; WITH BIOPSY, SINGLE OR MULTIPLE;  Surgeon: Ozell JONETTA Dais, MD;  Location: PEDS PROCEDURE ROOM [facility];  Service: Gastroenterology    PR COLONOSCOPY W/BIOPSY SINGLE/MULTIPLE N/A 12/08/2014    Procedure: COLONOSCOPY, FLEXIBLE, PROXIMAL TO SPLENIC FLEXURE; WITH BIOPSY, SINGLE OR MULTIPLE;  Surgeon: Ozell Alm Dais, MD;  Location: PEDS PROCEDURE ROOM [facility];  Service: Gastroenterology    PR SURG DIAGNOSTIC EXAM, ANORECTAL N/A 10/09/2012    Procedure: ANORECTAL EXAM, SURGICAL, REQUIRING ANESTHESIA (GENERAL, SPINAL, OR EPIDURAL), DIAGNOSTIC;  Surgeon: Evalene CHRISTELLA Economy, MD;  Location: [facility];  Service: Pediatric Surgery    PR UPPER GI ENDOSCOPY,BIOPSY N/A 10/03/2012  Procedure: UGI ENDOSCOPY; WITH BIOPSY, SINGLE OR MULTIPLE;  Surgeon: Ozell JONETTA Dais, MD;  Location: PEDS PROCEDURE ROOM [facility];  Service: Gastroenterology    PR UPPER GI ENDOSCOPY,BIOPSY N/A 12/08/2014    Procedure: UGI ENDOSCOPY; WITH BIOPSY, SINGLE OR MULTIPLE;  Surgeon: Ozell Alm Dais, MD;  Location: PEDS PROCEDURE ROOM [facility];  Service: Gastroenterology     Family history:   Family History   Problem Relation Age of Onset    Other Mother      Social history:   Social History     Socioeconomic History    Marital status: Single     Spouse name: None    Number of children: None    Years of education: None    Highest education level: None   Tobacco Use    Smoking status: Never    Smokeless tobacco: Never   Substance and Sexual Activity    Alcohol use: Not Currently     Comment: maybe once a month    Drug use: No   Other Topics Concern    Do you use sunscreen? Yes    Tanning bed use? No    Are you easily burned? No    Excessive sun exposure? No    Blistering sunburns? No           Allergies:   Codeine          Medications:     Current Outpatient Medications   Medication Sig Dispense Refill    empty container Misc Use as directed to dispose of Stelara  1 each 3    multivitamin (THERAGRAN) per tablet Take 1 tablet by mouth daily.      STELARA  90 mg/mL Syrg syringe Inject the contents of 1 syringe (90 mg total) under the skin every 8 weeks. 1 mL 0     No current facility-administered medications for this visit.           Physical Exam:   BP 132/80 (BP Site: L Arm, BP Position: Sitting)  - Pulse 88  - Temp 36.4 ??C (97.5 ??F) (Temporal)  - Ht 170.2 cm (5' 7)  - Wt 77.7 kg (171 lb 4.8 oz)  - BMI 26.83 kg/m??     Constitutional: Oriented to person, place, and time. Appears well-developed and well-nourished. No distress.   Eyes: No scleral icterus. No conjunctival injection appreciated.   ENT: normal auricles, nasal septum midline, lips moist  Pulmonary/Chest: Effort normal. No tachypnea. No retractions. No wheezes.  Cardiovascular: No visible edema. RRR. No m/r/g.   GI: BS present. Abd soft, nt, nd.   Musculoskeletal: Normal range of motion. no swelling or deformity  Neurological: Alert and oriented to person, place, and time. Cranial nerves grossly intact.   Skin: No rash noted.   Psychiatric: Normal mood and affect. Thought content normal.           Labs, Data & Indices:     Lab Review:   Lab Results   Component Value Date    WBC 9.1 09/04/2023    WBC 6.9 08/26/2019    RBC 5.27 09/04/2023    RBC 4.82 08/26/2019    HGB 14.9 09/04/2023    HGB 13.8 08/26/2019     Lab Results   Component Value Date    AST 17 09/04/2023    AST 20 08/26/2019    ALT 27 09/04/2023    ALT 27 08/26/2019    BUN 12 09/04/2023    BUN 14 08/26/2019    Creatinine 0.81 09/04/2023  Creatinine 0.91 08/26/2019    CO2 27.7 09/04/2023    CO2 25 08/26/2019    Albumin 4.6 09/04/2023    Albumin 5.0 03/25/2014    Calcium 9.9 09/04/2023    Calcium 9.7 08/26/2019     Lab Results   Component Value Date    TSH 1.830 08/15/2014        Disease Severity Index:   Harvey-Bradshaw Index:    1. General well-being: Very well = 0  2. Abdominal pain:  None = 0  3. Number of liquid or soft stools per day: 0  4. Abdominal mass: None = 0  5. Complications: None    Harvey-Bradshaw Index score:  Remission <5  ............................................................................................................................................SABRA          Assessment & Recommendations:     Allen Harris is a 28 y.o. 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 He is doing well overall and thus I will make no changes. He will alert me to any new symptoms. He continues in school and is alert to his care plan.     PLAN:  - Continue ustekinumab  q8w  - monitor symptoms closely  - pt to alert me to any changes    IBD Healthcare Maintenance   Last colorectal cancer screening  (q1-5 years, 8 years after diagnosis) 2026   Metabolic bone disease screening  (steroids >39mo, past steroid use >38yr in past 46yr, maternal FHx osteoporosis, underweight, amenorrheic, postmenopausal women) N/A   Vitamin D  deficiency screening  (q47yr) Low in 11/2021   Vitamin B12 deficiency screening  (If PSHx of ileal resection) Today   Iron deficiency screening Normal May 2023                 Vaccines Varicella  (Zoster IgG, consider vaccination if -) N/A    Herpes Zoster  (Anticipating IS) Declined    Tdap  (TDAP q10y or Td q2y) Declined    Influenza  (q35yr) Declined    HPV  (Age 10-26) Declined    Hepatitis A N/A    Hepatitis B UTD, but loss of immunity. Declined booster    Meningococcus  (At risk--college, military) Declined    Pneumococcus  (PSV23 if not IS; PCV13 then PSV23 after 8w if IS; PSV23 booster q34yr) Declined   Annual Pap smear  (if male and IS) N/A   Annual dermatology exam  (if IS) Referral placed   Tobacco abstinence Does not use   NSAID abstinence Rare use     Raetta Agostinelli L. Gwenn, MD, MPH  Associate Professor of Medicine  Division of Gastroenterology and Hepatology  [employer]  at [facility]    I personally spent 32 minutes face-to-face and non-face-to-face in the care of this patient, which includes all pre, intra, and post visit time on the date of service.  All documented time was specific to the E/M visit and does not include any procedures that may have been performed.

## 2023-09-04 NOTE — Unmapped (Signed)
 1. Please continue to monitor for any new symptoms.   2. We will plan for a colonoscopy next year (2026)  3. We will check labs today   4. Please do not hesitate to contact with any questions.    Jonnelle Lawniczak L. Gwenn, MD, MPH  Associate Professor of Medicine  Division of Gastroenterology and Hepatology  [employer]  at [facility]    APPOINTMENT [facility] FOR GI CLINIC AND GI PROCEDURES:  GI MEDICINE CLINIC  [phone] option 1   GI PROCEDURES         [phone] option 2   To schedule, reschedule, or cancel your GI appointment, please call [phone]. If you are unable to come to an appointment, please notify us  as soon as possible, preferably 24 hours in advance. Doing so may allow other patients with urgent needs to be scheduled in a cancelled appointment slot.   RADIOLOGY - to schedule your imaging procedure, please call [phone] opt 1     IBD NURSE COORDINATOR CONTACT - Duwaine Legions  Phone: [phone] (direct line)   Fax: [phone]  For urgent medical concerns after hours or on weekends and holidays, call [phone] and ask to speak to the GI Fellow on call.    If you have a GI medical question or GI symptoms and would like to speak to your provider's healthcare team, please contact Duwaine Legions (contact information above) OR you can send the GI healthcare team a message through MyChart at [email or web address]    TEST RESULTS   If you have a MyChart account, your new results will be sent to you through your MyChart account at [email or web address]. For results that require follow-up, a member of your healthcare team will also contact you directly.    PRESCRIPTION REFILL REQUESTS  To request prescription refills, please contact your pharmacy or send your healthcare team a message through your MyChart account at [email or web address]  RECORD REQUESTS  For questions related to medical records, please call Medical Records Release of Information at [phone]  FINANCIAL COUNSELOR   For billing and other financial questions/needs - please contact Reginald Reavis at [phone]. If you need to leave a message, please be sure to leave your full name, date of birth or MR#, best call back # and reason for call.

## 2023-09-20 DIAGNOSIS — K5080 Crohn's disease of both small and large intestine without complications: Principal | ICD-10-CM

## 2023-09-20 MED ORDER — STELARA 90 MG/ML SUBCUTANEOUS SYRINGE
SUBCUTANEOUS | 0 refills | 56.00000 days
Start: 2023-09-20 — End: ?

## 2023-09-21 MED ORDER — STELARA 90 MG/ML SUBCUTANEOUS SYRINGE
SUBCUTANEOUS | 2 refills | 56.00000 days | Status: CP
Start: 2023-09-21 — End: ?
  Filled 2023-09-22: qty 1, 56d supply, fill #0

## 2023-09-21 NOTE — Unmapped (Signed)
 Encounter for refill request:  Last clinic visit: 09/04/2023  Colonoscopy:     Lab Results   Component Value Date    WBC 9.1 09/04/2023    RBC 5.27 09/04/2023    HGB 14.9 09/04/2023    HCT 43.2 09/04/2023    PLT 441 09/04/2023    ALT 27 09/04/2023    AST 17 09/04/2023    ALKPHOS 103 09/04/2023    CRP <5.0 09/04/2023    CREATININE 0.81 09/04/2023       Stelara  refills authorized x 6 month supply.

## 2023-09-22 NOTE — Unmapped (Signed)
 [facility] Specialty and Home Delivery Pharmacy Refill Coordination Note    Allen Harris, DOB: [birth year]  Phone: [phone] (home)       All above HIPAA information was verified with patient.         09/21/2023     5:05 PM   Specialty Rx Medication Refill Questionnaire   Which Medications would you like refilled and shipped? Stelara    Please list all current allergies: Codeine   Have you missed any doses in the last 30 days? No   Have you had any changes to your medication(s) since your last refill? No   How much of each medication do you have remaining at home? (eg. number of tablets, injections, etc.) 0   If receiving an injectable medication, next injection date is 09/22/2023   Have you experienced any side effects in the last 30 days? No   Please enter the full address (street address, city, state, zip code) where you would like your medication(s) to be delivered to. [address] [place] [address]   Please specify on which day you would like your medication(s) to arrive. Note: if you need your medication(s) within 3 days, please call the pharmacy to schedule your order at [phone]  09/21/2023   Has your insurance changed since your last refill? No   Would you like a pharmacist to call you to discuss your medication(s)? No   Do you require a signature for your package? (Note: if we are billing Medicare Part B or your order contains a controlled substance, we will require a signature) No   I have been provided my out of pocket cost for my medication and approve the pharmacy to charge the amount to my credit card on file. Yes         Completed refill call assessment today to schedule patient's medication shipment from the [facility] Delivery Pharmacy [phone]).  All relevant notes have been reviewed.       Confirmed patient received a Conservation officer, historic buildings and a Surveyor, mining with first shipment. The patient will receive a drug information handout for each medication shipped and additional FDA Medication Guides as required.         REFERRAL TO PHARMACIST     Referral to the pharmacist: Not needed      [facility]     Shipping address confirmed in Epic.     Delivery Scheduled: Yes, Expected medication delivery date: 09/22/2023.     Medication will be delivered via Same Day Courier to the prescription address in Epic WAM.    Lucie CHRISTELLA Forts   [facility] Specialty and Home Delivery Pharmacy Specialty Technician

## 2023-11-13 NOTE — Progress Notes (Signed)
 [facility] Specialty and Home Delivery Pharmacy Refill Coordination Note    Torrion Witter, DOB: [birth year]  Phone: [phone] (home)       All above HIPAA information was verified with patient.         11/13/2023     9:18 AM   Specialty Rx Medication Refill Questionnaire   Which Medications would you like refilled and shipped? Stelara    Please list all current allergies: Codeine   Have you missed any doses in the last 30 days? No   Have you had any changes to your medication(s) since your last refill? No   How much of each medication do you have remaining at home? (eg. number of tablets, injections, etc.) 0   If receiving an injectable medication, next injection date is 11/17/2023   Have you experienced any side effects in the last 30 days? No   Please enter the full address (street address, city, state, zip code) where you would like your medication(s) to be delivered to. [address]   Please specify on which day you would like your medication(s) to arrive. Note: if you need your medication(s) within 3 days, please call the pharmacy to schedule your order at [phone]  11/16/2023   Has your insurance changed since your last refill? No   Would you like a pharmacist to call you to discuss your medication(s)? No   Do you require a signature for your package? (Note: if we are billing Medicare Part B or your order contains a controlled substance, we will require a signature) No   I have been provided my out of pocket cost for my medication and approve the pharmacy to charge the amount to my credit card on file. Yes         Completed refill call assessment today to schedule patient's medication shipment from the [facility] and [facility] Delivery Pharmacy [phone]).  All relevant notes have been reviewed.       Confirmed patient received a Conservation Officer, Historic Buildings and a Surveyor, Mining with first shipment. The patient will receive a drug information handout for each medication shipped and additional FDA Medication Guides as required.         REFERRAL TO PHARMACIST     Referral to the pharmacist: Not needed      [facility]     Shipping address confirmed in Epic.     Delivery Scheduled: Yes, Expected medication delivery date: 11/16/23.     Medication will be delivered via Same Day Courier to the prescription address in Epic WAM.    Burnard DELENA Neighbors, PharmD   [facility] Specialty and Home Delivery Pharmacy Specialty Pharmacist

## 2023-11-16 MED FILL — STELARA 90 MG/ML SUBCUTANEOUS SYRINGE: SUBCUTANEOUS | 56 days supply | Qty: 1 | Fill #1

## 2024-01-14 NOTE — Progress Notes (Signed)
 [facility] Specialty and Home Delivery Pharmacy Refill Coordination Note    Daniela Hernan, DOB: 15-Oct-1995  Phone: [phone] (home)       All above HIPAA information was verified with patient's family member, mom Stephens.         01/14/2024     4:47 PM   Specialty Rx Medication Refill Questionnaire   Which Medications would you like refilled and shipped? Stelara     Please list all current allergies: codeine    Have you missed any doses in the last 30 days? No    Have you had any changes to your medication(s) since your last refill? No    How much of each medication do you have remaining at home? (eg. number of tablets, injections, etc.) 0    If receiving an injectable medication, next injection date is 01/19/2024    Have you experienced any side effects in the last 30 days? No    Please enter the full address (street address, city, state, zip code) where you would like your medication(s) to be delivered to. [address]    Please specify on which day you would like your medication(s) to arrive. Note: if you need your medication(s) within 3 days, please call the pharmacy to schedule your order at [phone]  01/17/2024    Has your insurance changed since your last refill? No    Would you like a pharmacist to call you to discuss your medication(s)? No    Do you require a signature for your package? (Note: if we are billing Medicare Part B or your order contains a controlled substance, we will require a signature) No    I have been provided my out of pocket cost for my medication and approve the pharmacy to charge the amount to my credit card on file. Yes        Proxy-reported         Completed refill call assessment today to schedule patient's medication shipment from the [facility] Specialty and Home Delivery Pharmacy [phone]).  All relevant notes have been reviewed.       Confirmed patient received a Conservation Officer, Historic Buildings and a Surveyor, Mining with first shipment. The patient will receive a drug information handout for each medication shipped and additional FDA Medication Guides as required.         REFERRAL TO PHARMACIST     Referral to the pharmacist: Not needed      [facility]     Shipping address confirmed in Epic.     Delivery Scheduled: Yes, Expected medication delivery date: 01/17/24.     Medication will be delivered via Next Day Courier to the prescription address in Epic WAM.    Vernell Galloway, PharmD   [facility] Specialty and Home Delivery Pharmacy Specialty Pharmacist

## 2024-01-16 MED FILL — STELARA 90 MG/ML SUBCUTANEOUS SYRINGE: SUBCUTANEOUS | 56 days supply | Qty: 1 | Fill #2
